# Patient Record
Sex: Male | Born: 1990
Health system: Southern US, Community
[De-identification: ages and names within clinical notes are randomized; demographics above are authoritative.]

## PROBLEM LIST (undated history)

## (undated) HISTORY — PX: FOOT SURGERY: SHX648

## (undated) HISTORY — PX: OTHER SURGICAL HISTORY: SHX169

---
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
## 2010-04-17 ENCOUNTER — Emergency Department (HOSPITAL_COMMUNITY): Admission: EM | Admit: 2010-04-17 | Discharge: 2010-04-17 | Payer: Self-pay | Admitting: Family Medicine

## 2010-09-15 ENCOUNTER — Emergency Department (HOSPITAL_COMMUNITY): Admission: EM | Admit: 2010-09-15 | Discharge: 2010-09-15 | Payer: Self-pay | Admitting: Family Medicine

## 2011-02-01 LAB — GC/CHLAMYDIA PROBE AMP, GENITAL
Chlamydia, DNA Probe: POSITIVE — AB
GC Probe Amp, Genital: NEGATIVE

## 2011-03-05 NOTE — Op Note (Signed)
NAMEADONAY, SCHEIER             ACCOUNT NO.:  [id number]   MEDICAL RECORD NO.:  [id number]          PATIENT TYPE:  AMB   LOCATION:  DSC                          FACILITY:  [facility]   PHYSICIAN:  Artist Pais. Weingold, M.D.DATE OF BIRTH:  10/01/1991   DATE OF PROCEDURE:  10/19/2006  DATE OF DISCHARGE:  10/19/2006                               OPERATIVE REPORT   PREOPERATIVE DIAGNOSIS:  Left fifth metacarpal fracture.   POSTOPERATIVE DIAGNOSIS:  Left fifth metacarpal fracture.   PROCEDURE:  Open reduction internal fixation using 1.6 mm intramedullary  rod.   SURGEON:  Artist Pais. Mina Marble, M.D.   ASSISTANT:  None.   ANESTHESIA:  Regional.   TOURNIQUET TIME:  35 minutes.   No complications or drains.   OPERATIVE REPORT:  The patient was taken to the operating suite.  After  the induction of adequate regional anesthetic, the left upper extremity  was prepped in the usual sterile fashion.  Once this was done, a small  incision was made over the base of the fifth metacarpal.  Skin was  incised.  Neurovascular bundles and tendons were carefully retracted.  The dorsal cortex of the base of the fifth metacarpal was exposed and  the IM rod was carefully introduced through a cortical window dorsally.  It was driven across the fracture site under direct fluoroscopic  guidance.  When reduction was achieved, it was cut outside the skin,  bent in a 90-degree fashion and cut below the skin.  The wound was  irrigated and loosely closed with 4-0 nylon.  A sterile dressing of  Xeroform, 4 x 4s, and a compressive ulnar gutter splint was applied.  The patient tolerated the procedure well and went to recovery room in  stable fashion.      Artist Pais Mina Marble, M.D.  Electronically Signed     MAW/MEDQ  D:  11/02/2006  T:  11/02/2006  Job:  [id number]

## 2011-03-05 NOTE — Op Note (Signed)
Craig Graham, Craig Graham             ACCOUNT NO.:  [id number]   MEDICAL RECORD NO.:  [id number]          PATIENT TYPE:  AMB   LOCATION:  DSC                          FACILITY:  [facility]   PHYSICIAN:  Artist Pais. Weingold, M.D.DATE OF BIRTH:  [birth year]   DATE OF PROCEDURE:  10/19/2006  DATE OF DISCHARGE:                               OPERATIVE REPORT   PREOPERATIVE DIAGNOSIS:  Displaced left fifth metacarpal fracture.   POSTOPERATIVE DIAGNOSIS:  Displaced left fifth metacarpal fracture.   PROCEDURE:  Open reduction and internal fixation of above with 1.6-mm  intramedullary rod.   SURGEON:  Artist Pais. Mina Marble, M.D.   ASSISTANT:  None.   ANESTHESIA:  General.   TOURNIQUET TIME:  26 minutes.   COMPLICATIONS:  None.   DRAINS:  None.   OPERATIVE REPORT:  The patient was taken to the operating suite.  After  the induction of adequate general anesthesia, the left upper extremity  was prepped and draped in sterile fashion.  An Esmarch was used to  exsanguinate the limb.  The tourniquet was inflated to 250 mmHg.  At  this point in time, a Jahss maneuver was performed and a closed  reduction of the fifth metacarpal fracture on the left hand was  undertaken.  At this point in time, a small incision was made over the  base of the fifth metacarpal at the [facility] joint.  The skin was incised.  Tendons were retracted.  The 1.6-mm IM rod awl was then used to create a  hole in the base of the metacarpal.  The IM rod was introduced into the  intramedullary canal.  It was driven across the fracture site under  direct and fluoroscopic guidance, thus, maintaining the reduction.  After this was done, the wound was irrigated.  Intraoperative  fluoroscopy revealed good reduction in both the A/P lateral and oblique  views.  The IM rod was cut outside the skin, bent upon itself and cut  below the skin with a wire cutter.  The wound was then closed with 4-0  nylon.  Xeroform, 4 x 4's and an ulnar gutter  splint were applied.  The  patient tolerated the procedure well and returned in stable fashion.      Artist Pais Mina Marble, M.D.  Electronically Signed     MAW/MEDQ  D:  10/19/2006  T:  10/19/2006  Job:  [id number]

## 2011-10-17 ENCOUNTER — Emergency Department (HOSPITAL_COMMUNITY)
Admission: EM | Admit: 2011-10-17 | Discharge: 2011-10-18 | Disposition: A | Discharge status: Home / Self Care | Payer: Medicaid Other | Attending: Emergency Medicine | Admitting: Emergency Medicine

## 2011-10-17 ENCOUNTER — Encounter: Payer: Self-pay | Admitting: *Deleted

## 2011-10-17 DIAGNOSIS — M5380 Other specified dorsopathies, site unspecified: Secondary | ICD-10-CM | POA: Insufficient documentation

## 2011-10-17 DIAGNOSIS — M545 Low back pain, unspecified: Secondary | ICD-10-CM | POA: Insufficient documentation

## 2011-10-17 DIAGNOSIS — F17200 Nicotine dependence, unspecified, uncomplicated: Secondary | ICD-10-CM | POA: Insufficient documentation

## 2011-10-17 DIAGNOSIS — M539 Dorsopathy, unspecified: Secondary | ICD-10-CM | POA: Insufficient documentation

## 2011-10-18 MED ORDER — IBUPROFEN 800 MG PO TABS: 800.0000 mg | ORAL_TABLET | Freq: Three times a day (TID) | ORAL | Status: AC

## 2011-10-18 MED ORDER — CYCLOBENZAPRINE HCL 10 MG PO TABS
10.0000 mg | ORAL_TABLET | Freq: Once | ORAL | Status: AC
  Administered 2011-10-18: 10 mg via ORAL
  Filled 2011-10-18: qty 1

## 2011-10-18 MED ORDER — CYCLOBENZAPRINE HCL 10 MG PO TABS: 10.0000 mg | ORAL_TABLET | Freq: Two times a day (BID) | ORAL | Status: AC | PRN

## 2011-10-18 MED ORDER — HYDROMORPHONE HCL PF 1 MG/ML IJ SOLN
1.0000 mg | Freq: Once | INTRAMUSCULAR | Status: AC
  Administered 2011-10-18: 1 mg via INTRAMUSCULAR
  Filled 2011-10-18: qty 1

## 2011-10-18 MED ORDER — IBUPROFEN 800 MG PO TABS
800.0000 mg | ORAL_TABLET | Freq: Once | ORAL | Status: AC
  Administered 2011-10-18: 800 mg via ORAL
  Filled 2011-10-18: qty 1

## 2011-10-18 NOTE — ED Provider Notes (Signed)
History     CSN: [id number]  Arrival date & time 10/17/11  2140   First MD Initiated Contact with Patient 10/18/11 0025      Chief Complaint  Patient presents with  . Back Pain    pt c/o lower back pain that began 1 week ago, denies injury or other symptoms.     (Consider location/radiation/quality/duration/timing/severity/associated sxs/prior treatment) Patient is a 20 y.o. male presenting with back pain. The history is provided by the patient.  Back Pain  This is a new problem. Episode onset: About 7 days ago. The problem occurs constantly. The problem has not changed since onset.Associated with: Has been lifting weights but no known injury or mechanism otherwise. The pain is present in the lumbar spine. The pain does not radiate. The pain is moderate. The symptoms are aggravated by bending, twisting and certain positions. The pain is the same all the time. Stiffness is present in the morning. Pertinent negatives include no chest pain, no fever, no numbness, no weight loss, no headaches, no abdominal pain, no bowel incontinence, no perianal numbness, no bladder incontinence, no dysuria, no pelvic pain, no leg pain, no paresthesias, no paresis, no tingling and no weakness. He has tried NSAIDs for the symptoms. The treatment provided mild relief. Risk factors: No history of same.   pain is sharp in quality and nonradiating. Pain is mostly right sided lumbar region.  History reviewed. No pertinent past medical history.  History reviewed. No pertinent past surgical history.  History reviewed. No pertinent family history.  History  Substance Use Topics  . Smoking status: Current Everyday Smoker    Types: Cigarettes  . Smokeless tobacco: Not on file  . Alcohol Use: No      Review of Systems  Constitutional: Negative for fever, chills and weight loss.  HENT: Negative for neck pain and neck stiffness.   Eyes: Negative for pain.  Respiratory: Negative for shortness of breath.     Cardiovascular: Negative for chest pain.  Gastrointestinal: Negative for abdominal pain, constipation and bowel incontinence.  Genitourinary: Negative for bladder incontinence, dysuria and pelvic pain.  Musculoskeletal: Positive for back pain. Negative for myalgias, joint swelling and gait problem.  Skin: Negative for rash.  Neurological: Negative for tingling, weakness, numbness, headaches and paresthesias.  All other systems reviewed and are negative.    Allergies  Hydrocodone  Home Medications   Current Outpatient Rx  Name Route Sig Dispense Refill  . IBUPROFEN 200 MG PO TABS Oral Take 600 mg by mouth every 6 (six) hours as needed. pain       BP 134/75  Pulse 66  Temp(Src) 98.7 F (37.1 C) (Oral)  Resp 18  Wt 152 lb (68.947 kg)  SpO2 100%  Physical Exam  Constitutional: He is oriented to person, place, and time. He appears well-developed and well-nourished.  HENT:  Head: Normocephalic and atraumatic.  Eyes: Conjunctivae and EOM are normal. Pupils are equal, round, and reactive to light.  Neck: Full passive range of motion without pain. Neck supple.  Cardiovascular: Normal rate, regular rhythm, S1 normal, S2 normal and intact distal pulses.   Pulmonary/Chest: Effort normal and breath sounds normal.  Abdominal: Soft. Bowel sounds are normal. There is no tenderness. There is no CVA tenderness.  Musculoskeletal: Normal range of motion.       Right para lumbar tenderness without midline tenderness or deformity. There is associated muscle spasm. No lower extremity deficits with equal DTRs, dorsi plantar flexion and sensorium to light touch throughout.  Neurological: He is alert and oriented to person, place, and time. He has normal strength and normal reflexes. No cranial nerve deficit or sensory deficit. He displays a negative Romberg sign. GCS eye subscore is 4. GCS verbal subscore is 5. GCS motor subscore is 6.       Normal Gait  Skin: Skin is warm and dry. No rash noted.  No cyanosis. Nails show no clubbing.  Psychiatric: He has a normal mood and affect. His speech is normal and behavior is normal.    ED Course  Procedures (including critical care time)  Ice and pain medications provided   MDM   Lumbar pain and muscle spasm in otherwise healthy young adult male has been lifting weights. Plan ice and conservative treatment with Motrin and Flexeril. Primary care provider referral provided. Patient states understanding strict return precautions and is stable for discharge home without indication for imaging at this time.        Sunnie Nielsen, MD 10/18/11 [phone]

## 2012-09-24 ENCOUNTER — Emergency Department (HOSPITAL_COMMUNITY)
Admission: EM | Admit: 2012-09-24 | Discharge: 2012-09-24 | Disposition: A | Discharge status: Home / Self Care | Payer: No Typology Code available for payment source | Attending: Emergency Medicine | Admitting: Emergency Medicine

## 2012-09-24 ENCOUNTER — Encounter (HOSPITAL_COMMUNITY): Payer: Self-pay | Admitting: Emergency Medicine

## 2012-09-24 ENCOUNTER — Emergency Department (HOSPITAL_COMMUNITY): Payer: No Typology Code available for payment source

## 2012-09-24 DIAGNOSIS — IMO0002 Reserved for concepts with insufficient information to code with codable children: Secondary | ICD-10-CM | POA: Insufficient documentation

## 2012-09-24 DIAGNOSIS — S8000XA Contusion of unspecified knee, initial encounter: Secondary | ICD-10-CM | POA: Insufficient documentation

## 2012-09-24 DIAGNOSIS — S8002XA Contusion of left knee, initial encounter: Secondary | ICD-10-CM

## 2012-09-24 DIAGNOSIS — R404 Transient alteration of awareness: Secondary | ICD-10-CM | POA: Insufficient documentation

## 2012-09-24 DIAGNOSIS — Y9389 Activity, other specified: Secondary | ICD-10-CM | POA: Insufficient documentation

## 2012-09-24 DIAGNOSIS — F17200 Nicotine dependence, unspecified, uncomplicated: Secondary | ICD-10-CM | POA: Insufficient documentation

## 2012-09-24 DIAGNOSIS — Y92410 Unspecified street and highway as the place of occurrence of the external cause: Secondary | ICD-10-CM | POA: Insufficient documentation

## 2012-09-24 DIAGNOSIS — S0990XA Unspecified injury of head, initial encounter: Secondary | ICD-10-CM | POA: Insufficient documentation

## 2012-09-24 MED ORDER — OXYCODONE-ACETAMINOPHEN 5-325 MG PO TABS
1.0000 | ORAL_TABLET | Freq: Once | ORAL | Status: AC
  Administered 2012-09-24: 1 via ORAL
  Filled 2012-09-24: qty 1
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
## 2012-09-25 NOTE — ED Provider Notes (Signed)
I have personally seen and examined the patient.  I have discussed the plan of care with the resident.  I have reviewed the documentation on PMH/FH/Soc. History.  I have reviewed the documentation of the resident and agree.  Doug Sou, MD 09/25/12 1134

## 2012-11-29 ENCOUNTER — Emergency Department (HOSPITAL_COMMUNITY)
Admission: EM | Admit: 2012-11-29 | Discharge: 2012-11-29 | Disposition: A | Discharge status: Home / Self Care | Payer: Medicare Other | Source: Home / Self Care | Attending: Emergency Medicine | Admitting: Emergency Medicine

## 2012-11-29 ENCOUNTER — Encounter (HOSPITAL_COMMUNITY): Payer: Self-pay | Admitting: *Deleted

## 2012-11-29 ENCOUNTER — Other Ambulatory Visit (HOSPITAL_COMMUNITY)
Admission: RE | Admit: 2012-11-29 | Discharge: 2012-11-29 | Disposition: A | Discharge status: Home / Self Care | Payer: Medicare Other | Source: Ambulatory Visit | Attending: Emergency Medicine | Admitting: Emergency Medicine

## 2012-11-29 DIAGNOSIS — Z202 Contact with and (suspected) exposure to infections with a predominantly sexual mode of transmission: Secondary | ICD-10-CM

## 2012-11-29 DIAGNOSIS — Z113 Encounter for screening for infections with a predominantly sexual mode of transmission: Secondary | ICD-10-CM | POA: Insufficient documentation

## 2012-11-29 LAB — RPR: RPR Ser Ql: NONREACTIVE

## 2012-11-29 MED ORDER — CEFTRIAXONE SODIUM 250 MG IJ SOLR
INTRAMUSCULAR | Status: AC
  Filled 2012-11-29: qty 250

## 2012-11-29 MED ORDER — CEFTRIAXONE SODIUM 250 MG IJ SOLR
250.0000 mg | Freq: Once | INTRAMUSCULAR | Status: AC
  Administered 2012-11-29: 250 mg via INTRAMUSCULAR

## 2012-11-29 MED ORDER — AZITHROMYCIN 250 MG PO TABS
ORAL_TABLET | ORAL | Status: AC
  Filled 2012-11-29: qty 4

## 2012-11-29 MED ORDER — AZITHROMYCIN 250 MG PO TABS
1000.0000 mg | ORAL_TABLET | Freq: Once | ORAL | Status: AC
  Administered 2012-11-29: 1000 mg via ORAL

## 2012-11-29 NOTE — ED Provider Notes (Signed)
Chief Complaint  Patient presents with  . Exposure to STD    History of Present Illness:   JOVEN MOM is a 22 year old male who was informed by his babies mother that she tested positive for Chlamydia. He was informed just this past week and her last unprotected sex was 5 days ago. He denies any urethral discharge, dysuria, or penile pain. He's had no lesions of the penis, sores, blisters, ulcers, jaundice, or rash. No inguinal lymphadenopathy or testicular pain or swelling. He denies any systemic symptoms such as fever, chills, skin rash, or joint pain.  Review of Systems:  Other than noted above, the patient denies any of the following symptoms: Systemic:  No fevers chills, aches, weight loss, arthralgias, myalgias, or adenopathy. GI:  No abdominal pain, nausea or vomiting. GU:  No dysuria, penile pain, discharge, itching, dysuria, genital lesions, testicular pain or swelling. Skin:  No rash or itching.  PMFSH:  Past medical history, family history, social history, meds, and allergies were reviewed.  Physical Exam:   Vital signs:  BP 130/86  Pulse 72  Temp(Src) 98.6 F (37 C) (Oral)  Resp 16  SpO2 100% Gen:  Alert, oriented, in no distress. Abdomen:  Soft and flat, non-distended, and non-tender.  No organomegaly or mass. Genital:  Exam is completely normal including no urethral discharge, no lesions of the penis, normal testes, no inguinal lymphadenopathy. Skin:  Warm and dry.  No rash.   Other Labs Obtained at [facility]gent [facility]:  DNA probes were obtained for gonorrhea, Chlamydia, Trichomonas as well as serologies for HIV and syphilis.  Results are pending at this time and we will call about any positive results.  Medications given in [facility]:  He was given Rocephin 250 mg IM and azithromycin 1000 mg by mouth.  Assessment:  The encounter diagnosis was Exposure to STD.  Plan:   1.  The following meds were prescribed:  There are no discharge medications for this patient.  2.   The patient was instructed in symptomatic care and handouts were given. 3.  The patient was told to return if becoming worse in any way, if no better in 3 or 4 days, and given some red flag symptoms that would indicate earlier return. 4.  The patient was instructed to inform all sexual contacts, avoid intercourse completely for 2 weeks and then only with a condom.  The patient was told that we would call about all abnormal lab results, and that we would need to report certain kinds of infection to the health department.    Reuben Likes, MD 11/29/12 [phone]

## 2012-11-29 NOTE — ED Notes (Signed)
Pt  States he  Was  Told  By  His   babys  Mother  That  She  Had  An std   - he  Reports  He  still Has  Relations   With   Her  He  denys  Any  Symptoms

## 2012-11-30 NOTE — ED Notes (Addendum)
HIV/RPR non-reactive, GC/Chlamydia pending. Craig Graham 11/30/2012 GC/Chlamydia/Trich neg. Craig Graham 12/04/2012

## 2015-09-14 ENCOUNTER — Emergency Department (HOSPITAL_COMMUNITY)
Admission: EM | Admit: 2015-09-14 | Discharge: 2015-09-14 | Disposition: A | Discharge status: Home / Self Care | Payer: Medicare Other | Attending: Emergency Medicine | Admitting: Emergency Medicine

## 2015-09-14 ENCOUNTER — Encounter (HOSPITAL_COMMUNITY): Payer: Self-pay | Admitting: *Deleted

## 2015-09-14 DIAGNOSIS — Y9389 Activity, other specified: Secondary | ICD-10-CM | POA: Diagnosis not present

## 2015-09-14 DIAGNOSIS — S0001XA Abrasion of scalp, initial encounter: Secondary | ICD-10-CM | POA: Diagnosis not present

## 2015-09-14 DIAGNOSIS — W503XXA Accidental bite by another person, initial encounter: Secondary | ICD-10-CM

## 2015-09-14 DIAGNOSIS — Y92810 Car as the place of occurrence of the external cause: Secondary | ICD-10-CM | POA: Insufficient documentation

## 2015-09-14 DIAGNOSIS — Y998 Other external cause status: Secondary | ICD-10-CM | POA: Insufficient documentation

## 2015-09-14 DIAGNOSIS — F17210 Nicotine dependence, cigarettes, uncomplicated: Secondary | ICD-10-CM | POA: Insufficient documentation

## 2015-09-14 DIAGNOSIS — S61251A Open bite of left index finger without damage to nail, initial encounter: Secondary | ICD-10-CM | POA: Insufficient documentation

## 2015-09-14 DIAGNOSIS — Z23 Encounter for immunization: Secondary | ICD-10-CM | POA: Diagnosis not present

## 2015-09-14 DIAGNOSIS — S6990XA Unspecified injury of unspecified wrist, hand and finger(s), initial encounter: Secondary | ICD-10-CM

## 2015-09-14 DIAGNOSIS — S61252A Open bite of right middle finger without damage to nail, initial encounter: Secondary | ICD-10-CM | POA: Diagnosis not present

## 2015-09-14 MED ORDER — TETANUS-DIPHTH-ACELL PERTUSSIS 5-2.5-18.5 LF-MCG/0.5 IM SUSP
0.5000 mL | Freq: Once | INTRAMUSCULAR | Status: AC
  Administered 2015-09-14: 0.5 mL via INTRAMUSCULAR
  Filled 2015-09-14: qty 0.5

## 2015-09-14 MED ORDER — AMOXICILLIN-POT CLAVULANATE 875-125 MG PO TABS: 1.0000 | ORAL_TABLET | Freq: Two times a day (BID) | ORAL | Status: DC

## 2015-09-14 MED ORDER — IBUPROFEN 400 MG PO TABS
800.0000 mg | ORAL_TABLET | Freq: Once | ORAL | Status: AC
  Administered 2015-09-14: 800 mg via ORAL
  Filled 2015-09-14: qty 2
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
## 2015-11-01 ENCOUNTER — Encounter (HOSPITAL_COMMUNITY): Payer: Self-pay | Admitting: *Deleted

## 2015-11-01 ENCOUNTER — Emergency Department (INDEPENDENT_AMBULATORY_CARE_PROVIDER_SITE_OTHER)
Admission: EM | Admit: 2015-11-01 | Discharge: 2015-11-01 | Disposition: A | Discharge status: Home / Self Care | Payer: Medicare Other | Source: Home / Self Care

## 2015-11-01 ENCOUNTER — Emergency Department (INDEPENDENT_AMBULATORY_CARE_PROVIDER_SITE_OTHER): Payer: Medicare Other

## 2015-11-01 DIAGNOSIS — S62609A Fracture of unspecified phalanx of unspecified finger, initial encounter for closed fracture: Secondary | ICD-10-CM

## 2015-11-01 DIAGNOSIS — S62310A Displaced fracture of base of second metacarpal bone, right hand, initial encounter for closed fracture: Secondary | ICD-10-CM | POA: Diagnosis not present

## 2015-11-01 MED ORDER — TRAMADOL HCL 50 MG PO TABS: 50.0000 mg | ORAL_TABLET | Freq: Four times a day (QID) | ORAL | Status: DC | PRN

## 2015-11-01 NOTE — Discharge Instructions (Signed)
See orthopedist on Monday for re-eval of broken finger.
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
## 2015-11-05 DIAGNOSIS — S62610A Displaced fracture of proximal phalanx of right index finger, initial encounter for closed fracture: Secondary | ICD-10-CM | POA: Diagnosis not present

## 2015-11-29 ENCOUNTER — Encounter (HOSPITAL_COMMUNITY): Payer: Self-pay | Admitting: Emergency Medicine

## 2015-11-29 ENCOUNTER — Emergency Department (INDEPENDENT_AMBULATORY_CARE_PROVIDER_SITE_OTHER)
Admission: EM | Admit: 2015-11-29 | Discharge: 2015-11-29 | Disposition: A | Discharge status: Home / Self Care | Payer: Medicare Other | Source: Home / Self Care | Attending: Family Medicine | Admitting: Family Medicine

## 2015-11-29 DIAGNOSIS — M5441 Lumbago with sciatica, right side: Secondary | ICD-10-CM | POA: Diagnosis not present

## 2015-11-29 MED ORDER — KETOROLAC TROMETHAMINE 60 MG/2ML IM SOLN
60.0000 mg | Freq: Once | INTRAMUSCULAR | Status: AC
  Administered 2015-11-29: 60 mg via INTRAMUSCULAR

## 2015-11-29 MED ORDER — CYCLOBENZAPRINE HCL 10 MG PO TABS: 10.0000 mg | ORAL_TABLET | Freq: Two times a day (BID) | ORAL | Status: DC | PRN

## 2015-11-29 MED ORDER — KETOROLAC TROMETHAMINE 60 MG/2ML IM SOLN
INTRAMUSCULAR | Status: AC
  Filled 2015-11-29: qty 2

## 2015-11-29 MED ORDER — NAPROXEN 500 MG PO TABS: 500.0000 mg | ORAL_TABLET | Freq: Two times a day (BID) | ORAL | Status: DC

## 2015-11-29 NOTE — ED Notes (Signed)
Patient c/o left side lower back pain and leg pain x 2 weeks. Patient reports it happened suddenly and as he was bending over. 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 Patient has tried various over the counter pain relief with no relief of symptoms. He has tried prescription muscle relaxer that does help with the pain. He is alert and oriented and in NAD.

## 2015-11-29 NOTE — Discharge Instructions (Signed)
It is a pleasure to see you today.    For the low back pain, I am prescribing the following two medications:  1. NAPROXEN  tablets, take 1 tablet by mouth every 12 hours.   2. CYCLOBENZAPRINE  tablets, take 1 tablet by mouth two times a day for muscle spasms.  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   Avoid bending/lifting.  You may use heating pad/hot water bottle for periodic relief.   Note for work is given.

## 2015-11-29 NOTE — ED Provider Notes (Signed)
CSN: [id number]     Arrival date & time 11/29/15  1304 History   First MD Initiated Contact with Patient 11/29/15 1339     Chief Complaint  Patient presents with  . Back Pain  . Leg Pain   (Consider location/radiation/quality/duration/timing/severity/associated sxs/prior Treatment) Patient is a 25 y.o. male presenting with back pain and leg pain. The history is provided by the patient. No language interpreter was used.  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 Sudden onset of pain on R side of back, radiated down back of R leg.  No urinary or bowel incontinence; has limited use of R leg due to pain but no true weakness.  No prior trauma to back or R leg.   Patient has been taking ibuprofen 2-4 tablets at a time with minimal relief. Works in [employer] and has had trouble with work due to pain (does lifting at work).   Has had girlfriend apply massage without true relief.    ROS No fevers/chills, no neck or UE pain.  No pain on L side of low back or L leg.  No falls. Did have a spasm along the R leg earlier today at work (noon) and this resolved on its own.   History reviewed. No pertinent past medical history. Past Surgical History  Procedure Laterality Date  . Left hand surgery     No family history on file. Social History  Substance Use Topics  . Smoking status: Current Every Day Smoker -- 1.00 packs/day    Types: Cigarettes  . Smokeless tobacco: Never Used  . Alcohol Use: No    Review of Systems  Constitutional: Negative for fever, chills and fatigue.  Musculoskeletal: Positive for back pain.  All other systems reviewed and are negative.   Allergies  Hydrocodone  Home Medications   Prior to Admission medications    Medication Sig Start Date End Date Taking? Authorizing Provider  amoxicillin-clavulanate (AUGMENTIN) 875-125 MG tablet Take 1 tablet by mouth 2 (two) times daily. 09/14/15   Cheri Fowler, PA-C  cyclobenzaprine (FLEXERIL) 10 MG tablet Take 1 tablet (10 mg total) by mouth 2 (two) times daily as needed for muscle spasms. 11/29/15   Barbaraann Barthel, MD  naproxen (NAPROSYN) 500 MG tablet Take 1 tablet (500 mg total) by mouth 2 (two) times daily. 11/29/15   Barbaraann Barthel, MD  traMADol (ULTRAM) 50 MG tablet Take 1 tablet (50 mg total) by mouth every 6 (six) hours as needed. 11/01/15   Linna Hoff, MD   Meds Ordered and Administered this Visit   Medications  ketorolac (TORADOL) injection 60 mg (not administered)    BP 134/86 mmHg  Pulse 93  Temp(Src) 98.6 F (37 C) (Oral)  SpO2 100% No data found.   Physical Exam  Constitutional: He appears well-developed and well-nourished.  Neck: Normal range of motion. Neck supple.  Musculoskeletal:  Full strength and sensation in UEs, shoulders.  Palpable radial pulses and full symmetric handgrip bilaterally.   Tenderness in paraspinous mm at the level of upper lumbar on the R side of midline. Not point tenderness over vertebral processes.  Tenderness at [facility] joint with palpation.  No skin changes in lower back.  Patient is able  to perform hip flexion bilaterally, grossly symmetric. SLR to 30 degrees on R, 45 degrees on L.  Lymphadenopathy:    He has no cervical adenopathy.  Neurological:  Full strength and sensation in UEs, shoulders.  Palpable radial pulses and full symmetric handgrip bilaterally.   Tenderness in paraspinous mm at the level of upper lumbar on the R side of midline. Not point tenderness over vertebral processes.  Tenderness at [facility] joint with palpation.  No skin changes in lower back.  Patient is able to perform hip flexion bilaterally, grossly symmetric. SLR to 30 degrees on R, 45 degrees on L. Sensation in both feet symmetric and grossly  intact. DP pulses bilaterally present.  No clonus. DTRs patellar 1+ symmetrically.  Dorsi/plantarflexion symmetric and full bilaterally.     ED Course  Procedures (including critical care time)  Labs Review Labs Reviewed - No data to display  Imaging Review No results found.   Visual Acuity Review  Right Eye Distance:   Left Eye Distance:   Bilateral Distance:    Right Eye Near:   Left Eye Near:    Bilateral Near:         MDM   1. Right-sided low back pain with right-sided sciatica    LBP with likely R sided radiculopathy, no s/sx of cauda equina syndrome.  No hyperreflexia or neurologic weakness. Treatment with Toradol  IM today; mainstay of care at home is NSAIDs and may benefit from muscle relaxants given paraspinous pain in LS region.  Discussed work restrictions for a few days.  Encouraged to establish with a primary care physician as well.  To return to [facility] if worsening or other concerns.   Hives with hydrocodone, avoidance of opioids due to this.   Paula Compton, MD    Barbaraann Barthel, MD 11/29/15 [phone]

## 2016-05-13 ENCOUNTER — Emergency Department
Admission: EM | Admit: 2016-05-13 | Discharge: 2016-05-13 | Disposition: A | Discharge status: Home / Self Care | Payer: Medicare Other | Attending: Student | Admitting: Student

## 2016-05-13 DIAGNOSIS — L02411 Cutaneous abscess of right axilla: Secondary | ICD-10-CM | POA: Diagnosis not present

## 2016-05-13 DIAGNOSIS — F1290 Cannabis use, unspecified, uncomplicated: Secondary | ICD-10-CM | POA: Insufficient documentation

## 2016-05-13 DIAGNOSIS — Z792 Long term (current) use of antibiotics: Secondary | ICD-10-CM | POA: Diagnosis not present

## 2016-05-13 DIAGNOSIS — F17210 Nicotine dependence, cigarettes, uncomplicated: Secondary | ICD-10-CM | POA: Diagnosis not present

## 2016-05-13 DIAGNOSIS — Z79899 Other long term (current) drug therapy: Secondary | ICD-10-CM | POA: Insufficient documentation

## 2016-05-13 DIAGNOSIS — L539 Erythematous condition, unspecified: Secondary | ICD-10-CM | POA: Diagnosis present

## 2016-05-13 MED ORDER — IBUPROFEN 600 MG PO TABS: 600.0000 mg | ORAL_TABLET | Freq: Three times a day (TID) | ORAL | 0 refills | Status: DC | PRN

## 2016-05-13 MED ORDER — TRAMADOL HCL 50 MG PO TABS: 50.0000 mg | ORAL_TABLET | Freq: Four times a day (QID) | ORAL | 0 refills | Status: DC | PRN

## 2016-05-13 MED ORDER — LIDOCAINE-EPINEPHRINE (PF) 1 %-1:200000 IJ SOLN
INTRAMUSCULAR | Status: AC
  Filled 2016-05-13: qty 30

## 2016-05-13 MED ORDER — TRAMADOL HCL 50 MG PO TABS
50.0000 mg | ORAL_TABLET | Freq: Once | ORAL | Status: AC
  Administered 2016-05-13: 50 mg via ORAL
  Filled 2016-05-13: qty 1

## 2016-05-13 MED ORDER — IBUPROFEN 600 MG PO TABS
600.0000 mg | ORAL_TABLET | Freq: Once | ORAL | Status: AC
  Administered 2016-05-13: 600 mg via ORAL
  Filled 2016-05-13: qty 1

## 2016-05-13 MED ORDER — SULFAMETHOXAZOLE-TRIMETHOPRIM 800-160 MG PO TABS: 1.0000 | ORAL_TABLET | Freq: Two times a day (BID) | ORAL | 0 refills | Status: DC

## 2016-05-13 NOTE — ED Provider Notes (Signed)
[facility] Emergency Department Provider Note   ____________________________________________   First MD Initiated Contact with Patient 05/13/16 [phone]     (approximate)  I have reviewed the triage vital signs and the nursing notes.   HISTORY  Chief Complaint Abscess    HPI Craig Graham is a 25 y.o. male complaining of a nausea lesion right axillary area for 3 days. Patient stated mild discharge. Patient state first occurrence of this, complaint.Patient rates the pain as a 10 over 10. No palliative measures for this complaint.   History reviewed. No pertinent past medical history.  There are no active problems to display for this patient.   Past Surgical History:  Procedure Laterality Date  . left hand surgery      Prior to Admission medications   Medication Sig Start Date End Date Taking? Authorizing Provider  amoxicillin-clavulanate (AUGMENTIN) 875-125 MG tablet Take 1 tablet by mouth 2 (two) times daily. 09/14/15   Cheri Fowler, PA-C  cyclobenzaprine (FLEXERIL) 10 MG tablet Take 1 tablet (10 mg total) by mouth 2 (two) times daily as needed for muscle spasms. 11/29/15   Barbaraann Barthel, MD  naproxen (NAPROSYN) 500 MG tablet Take 1 tablet (500 mg total) by mouth 2 (two) times daily. 11/29/15   Barbaraann Barthel, MD  traMADol (ULTRAM) 50 MG tablet Take 1 tablet (50 mg total) by mouth every 6 (six) hours as needed. 11/01/15   Linna Hoff, MD    Allergies Hydrocodone  No family history on file.  Social History Social History  Substance Use Topics  . Smoking status: Current Every Day Smoker    Packs/day: 1.00    Types: Cigarettes  . Smokeless tobacco: Never Used  . Alcohol use No    Review of Systems Constitutional: No fever/chills Eyes: No visual changes. ENT: No sore throat. Cardiovascular: Denies chest pain. Respiratory: Denies shortness of breath. Gastrointestinal: No abdominal pain.  No nausea, no vomiting.  No diarrhea.  No  constipation. Genitourinary: Negative for dysuria. Musculoskeletal: Negative for back pain. Skin: Negative for rash.Swelling right axillary area Neurological: Negative for headaches, focal weakness or numbness.   ____________________________________________   PHYSICAL EXAM:  VITAL SIGNS: ED Triage Vitals  Enc Vitals Group     BP 05/13/16 0858 127/70     Pulse Rate 05/13/16 0858 74     Resp 05/13/16 0858 16     Temp 05/13/16 0858 98.1 F (36.7 C)     Temp Source 05/13/16 0858 Oral     SpO2 05/13/16 0858 100 %     Weight 05/13/16 0858 172 lb (78 kg)     Height 05/13/16 0858  (1.803 m)     Head Circumference --      Peak Flow --      Pain Score 05/13/16 0853 10     Pain Loc --      Pain Edu? --      Excl. in GC? --     Constitutional: Alert and oriented. Well appearing and in no acute distress. Eyes: Conjunctivae are normal. PERRL. EOMI. Head: Atraumatic. Nose: No congestion/rhinnorhea. Mouth/Throat: Mucous membranes are moist.  Oropharynx non-erythematous. Neck: No stridor.  No cervical spine tenderness to palpation. Hematological/Lymphatic/Immunilogical: No cervical lymphadenopathy. Cardiovascular: Normal rate, regular rhythm. Grossly normal heart sounds.  Good peripheral circulation. Respiratory: Normal respiratory effort.  No retractions. Lungs CTAB. Gastrointestinal: Soft and nontender. No distention. No abdominal bruits. No CVA tenderness. Musculoskeletal: No lower extremity tenderness nor edema.  No joint  effusions. Neurologic:  Normal speech and language. No gross focal neurologic deficits are appreciated. No gait instability. Skin:  Skin is warm, dry and intact. No rash noted. Erythematous edematous nodular lesion right axillary area. Psychiatric: Mood and affect are normal. Speech and behavior are normal.  ____________________________________________   LABS (all labs ordered are listed, but only abnormal results are displayed)  Labs Reviewed - No data  to display ____________________________________________  EKG   ____________________________________________  RADIOLOGY   ____________________________________________   PROCEDURES  Procedure(s) performed: INCISION AND DRAINAGE Performed by: Joni Reining Consent: Verbal consent obtained. Risks and benefits: risks, benefits and alternatives were discussed Type: abscess  Body area: Right axillary  Anesthesia: local infiltration  Incision was made with a scalpel.  Local anesthetic: lidocaine 1% with epinephrine  Anesthetic total: 5 ml  Complexity: complex Blunt dissection to break up loculations  Drainage: purulent  Drainage amount: Small Packing material: 1/4 in iodoform gauze  Patient tolerance: Patient tolerated the procedure well with no immediate complications.     Procedures  Critical Care performed: No  ____________________________________________   INITIAL IMPRESSION / ASSESSMENT AND PLAN / ED COURSE  Pertinent labs & imaging results that were available during my care of the patient were reviewed by me and considered in my medical decision making (see chart for details).  Right axillary abscess. Status post incision and drainage patient given discharge care instructions. Patient advised return back in 2 days for reevaluation. Patient given a prescription for Bactrim DS, tramadol, and ibuprofen.  Clinical Course     ____________________________________________   FINAL CLINICAL IMPRESSION(S) / ED DIAGNOSES  Final diagnoses:  None      NEW MEDICATIONS STARTED DURING THIS VISIT:  New Prescriptions   No medications on file     Note:  This document was prepared using Dragon voice recognition software and may include unintentional dictation errors.    Joni Reining, PA-C 05/13/16 [id number]    Gayla Doss, MD 05/13/16 [phone]

## 2016-05-13 NOTE — ED Triage Notes (Signed)
Pt c/o abscess to the right axillary for the past 3 days with some drainage noted.Craig Graham

## 2016-05-15 ENCOUNTER — Encounter: Payer: Self-pay | Admitting: Emergency Medicine

## 2016-05-15 ENCOUNTER — Emergency Department
Admission: EM | Admit: 2016-05-15 | Discharge: 2016-05-15 | Disposition: A | Discharge status: Home / Self Care | Payer: Medicare Other | Attending: Emergency Medicine | Admitting: Emergency Medicine

## 2016-05-15 DIAGNOSIS — F17210 Nicotine dependence, cigarettes, uncomplicated: Secondary | ICD-10-CM | POA: Insufficient documentation

## 2016-05-15 DIAGNOSIS — Z5189 Encounter for other specified aftercare: Secondary | ICD-10-CM

## 2016-05-15 DIAGNOSIS — Z4800 Encounter for change or removal of nonsurgical wound dressing: Secondary | ICD-10-CM | POA: Diagnosis not present

## 2016-05-15 MED ORDER — OXYCODONE-ACETAMINOPHEN 5-325 MG PO TABS: 1.0000 | ORAL_TABLET | ORAL | 0 refills | Status: DC | PRN

## 2016-05-15 MED ORDER — LIDOCAINE HCL (PF) 1 % IJ SOLN
INTRAMUSCULAR | Status: AC
  Administered 2016-05-15: 10:00:00
  Filled 2016-05-15: qty 5

## 2016-05-15 NOTE — ED Provider Notes (Signed)
[facility] Emergency Department Provider Note  ____________________________________________  Time seen: Approximately 9:12 AM  I have reviewed the triage vital signs and the nursing notes.   HISTORY  Chief Complaint Wound Check    HPI Craig Graham is a 25 y.o. male presents for evaluation of abscess on his right axilla times one day. Patient was seen yesterday, had an I&D performed and is here for recheck. Denies any complaints at this time.   History reviewed. No pertinent past medical history.  There are no active problems to display for this patient.   Past Surgical History:  Procedure Laterality Date  . left hand surgery      Prior to Admission medications   Medication Sig Start Date End Date Taking? Authorizing Provider  amoxicillin-clavulanate (AUGMENTIN) 875-125 MG tablet Take 1 tablet by mouth 2 (two) times daily. 09/14/15   Cheri Fowler, PA-C  cyclobenzaprine (FLEXERIL) 10 MG tablet Take 1 tablet (10 mg total) by mouth 2 (two) times daily as needed for muscle spasms. 11/29/15   Barbaraann Barthel, MD  ibuprofen (ADVIL,MOTRIN) 600 MG tablet Take 1 tablet (600 mg total) by mouth every 8 (eight) hours as needed. 05/13/16   Joni Reining, PA-C  naproxen (NAPROSYN) 500 MG tablet Take 1 tablet (500 mg total) by mouth 2 (two) times daily. 11/29/15   Barbaraann Barthel, MD  oxyCODONE-acetaminophen (ROXICET) 5-325 MG tablet Take 1-2 tablets by mouth every 4 (four) hours as needed for severe pain. 05/15/16   Charmayne Sheer Vishwa Dais, PA-C  sulfamethoxazole-trimethoprim (BACTRIM DS,SEPTRA DS) 800-160 MG tablet Take 1 tablet by mouth 2 (two) times daily. 05/13/16   Joni Reining, PA-C    Allergies Hydrocodone  History reviewed. No pertinent family history.  Social History Social History  Substance Use Topics  . Smoking status: Current Every Day Smoker    Packs/day: 1.00    Types: Cigarettes  . Smokeless tobacco: Never Used  . Alcohol use No    Review of  Systems Constitutional: No fever/chills Cardiovascular: Denies chest pain. Respiratory: Denies shortness of breath. Genitourinary: Negative for dysuria. Musculoskeletal: Negative for back pain. Skin: Right axilla with a 2 x 3 cm area of tenderness and firmness. No increased erythema Neurological: Negative for headaches, focal weakness or numbness.  10-point ROS otherwise negative.  ____________________________________________   PHYSICAL EXAM:  VITAL SIGNS: ED Triage Vitals  Enc Vitals Group     BP      Pulse      Resp      Temp      Temp src      SpO2      Weight      Height      Head Circumference      Peak Flow      Pain Score      Pain Loc      Pain Edu?      Excl. in GC?     Constitutional: Alert and oriented. Well appearing and in no acute distress. Neck: No stridor. Supple, from, nt  Cardiovascular: Normal rate, regular rhythm. Grossly normal heart sounds.  Good peripheral circulation. Respiratory: Normal respiratory effort.  No retractions. Lungs CTAB. Musculoskeletal: No lower extremity tenderness nor edema.  No joint effusions. Neurologic:  Normal speech and language. No gross focal neurologic deficits are appreciated. No gait instability. Skin:  Skin is warm, dry and intact. No rash noted. Packing removed cleansed area with Betadine and attempted to remove any more pus none were available.  Repacked with 1 inch iodoform gauze and patient follow-up in 48 hours for recheck. Psychiatric: Mood and affect are normal. Speech and behavior are normal.  ____________________________________________   LABS (all labs ordered are listed, but only abnormal results are displayed)  Labs Reviewed - No data to display ____________________________________________  EKG   ____________________________________________  RADIOLOGY   ____________________________________________   PROCEDURES  Procedure(s) performed: None  Critical Care performed:  No  ____________________________________________   INITIAL IMPRESSION / ASSESSMENT AND PLAN / ED COURSE  Pertinent labs & imaging results that were available during my care of the patient were reviewed by me and considered in my medical decision making (see chart for details).  Continued all her medications. Stopped ibuprofen and placed on Percocet 5/325 for pain.  Clinical Course   Skin:  Skin is warm, dry and intact. No rash noted. Packing removed cleansed area with Betadine and attempted to remove any more pus none was available. Repacked with 1 inch iodoform gauze and patient follow-up in 48 hours for recheck. ____________________________________________   FINAL CLINICAL IMPRESSION(S) / ED DIAGNOSES  Final diagnoses:  Abscess re-check     This chart was dictated using voice recognition software/Dragon. Despite best efforts to proofread, errors can occur which can change the meaning. Any change was purely unintentional.    Evangeline Dakin, PA-C 05/15/16 8295    Nita Sickle, MD 05/15/16 [phone]

## 2016-05-15 NOTE — ED Notes (Signed)
NAD noted at time of D/C. Pt denies questions or concerns. Pt ambulatory to the lobby at this time.  

## 2016-05-15 NOTE — ED Notes (Signed)
Pt presents with abscess that was lanced on 7/27, pt presents with packing intact at this time, serosanguinous drainage noted to packing and dressing at this time. PA to bedside.

## 2016-05-15 NOTE — ED Triage Notes (Signed)
Pt presents for wound recheck of abscess to R axillary that was drained on 05/13/2016.

## 2016-05-16 LAB — AEROBIC CULTURE  (SUPERFICIAL SPECIMEN): SPECIAL REQUESTS: NORMAL

## 2016-05-16 LAB — AEROBIC CULTURE W GRAM STAIN (SUPERFICIAL SPECIMEN): Gram Stain: NONE SEEN

## 2016-05-17 ENCOUNTER — Emergency Department
Admission: EM | Admit: 2016-05-17 | Discharge: 2016-05-17 | Disposition: A | Discharge status: Home / Self Care | Payer: Medicare Other | Attending: Emergency Medicine | Admitting: Emergency Medicine

## 2016-05-17 DIAGNOSIS — Z5189 Encounter for other specified aftercare: Secondary | ICD-10-CM

## 2016-05-17 DIAGNOSIS — F17210 Nicotine dependence, cigarettes, uncomplicated: Secondary | ICD-10-CM | POA: Insufficient documentation

## 2016-05-17 DIAGNOSIS — Z4800 Encounter for change or removal of nonsurgical wound dressing: Secondary | ICD-10-CM | POA: Insufficient documentation

## 2016-05-17 MED ORDER — SULFAMETHOXAZOLE-TRIMETHOPRIM 800-160 MG PO TABS: 1.0000 | ORAL_TABLET | Freq: Two times a day (BID) | ORAL | 0 refills | Status: DC

## 2016-05-17 NOTE — ED Provider Notes (Signed)
[facility] Emergency Department Provider Note  ____________________________________________  Time seen: Approximately 9:45 AM  I have reviewed the triage vital signs and the nursing notes.   HISTORY  Chief Complaint Wound Check  HPI Craig Graham is a 25 y.o. male who presents to the emergency department for a wound check.He reports that the packing fell out of the site of I&D yesterday. He states that he has continued to notice some drainage, but otherwise is feeling much better. He is taking his antibiotic as prescribed.  History reviewed. No pertinent past medical history.  There are no active problems to display for this patient.   Past Surgical History:  Procedure Laterality Date  . left hand surgery      Prior to Admission medications   Medication Sig Start Date End Date Taking? Authorizing Provider  amoxicillin-clavulanate (AUGMENTIN) 875-125 MG tablet Take 1 tablet by mouth 2 (two) times daily. 09/14/15   Cheri Fowler, PA-C  cyclobenzaprine (FLEXERIL) 10 MG tablet Take 1 tablet (10 mg total) by mouth 2 (two) times daily as needed for muscle spasms. 11/29/15   Barbaraann Barthel, MD  ibuprofen (ADVIL,MOTRIN) 600 MG tablet Take 1 tablet (600 mg total) by mouth every 8 (eight) hours as needed. 05/13/16   Joni Reining, PA-C  naproxen (NAPROSYN) 500 MG tablet Take 1 tablet (500 mg total) by mouth 2 (two) times daily. 11/29/15   Barbaraann Barthel, MD  oxyCODONE-acetaminophen (ROXICET) 5-325 MG tablet Take 1-2 tablets by mouth every 4 (four) hours as needed for severe pain. 05/15/16   Charmayne Sheer Beers, PA-C  sulfamethoxazole-trimethoprim (BACTRIM DS,SEPTRA DS) 800-160 MG tablet Take 1 tablet by mouth 2 (two) times daily. 05/13/16   Joni Reining, PA-C  sulfamethoxazole-trimethoprim (BACTRIM DS,SEPTRA DS) 800-160 MG tablet Take 1 tablet by mouth 2 (two) times daily. 05/17/16   Chinita Pester, FNP    Allergies Hydrocodone  No family history on file.  Social  History Social History  Substance Use Topics  . Smoking status: Current Every Day Smoker    Packs/day: 1.00    Types: Cigarettes  . Smokeless tobacco: Never Used  . Alcohol use No    Review of Systems Constitutional: No fever/chills Eyes: No visual changes. ENT: No sore throat. Cardiovascular: Denies chest pain. Respiratory: Denies shortness of breath. Gastrointestinal: No abdominal pain.  No nausea, no vomiting.  Musculoskeletal: Negative for back pain. Skin: Right axilla surgical wound. Neurological: Negative for headaches, focal weakness or numbness. ___________________________________________   PHYSICAL EXAM:  VITAL SIGNS: ED Triage Vitals [05/17/16 0943]  Enc Vitals Group     BP 129/74     Pulse Rate 87     Resp 18     Temp 98.7 F (37.1 C)     Temp Source Oral     SpO2 100 %     Weight 172 lb (78 kg)     Height 5\' 11"  (1.803 m)     Head Circumference      Peak Flow      Pain Score      Pain Loc      Pain Edu?      Excl. in GC?     Constitutional: Alert and oriented. Well appearing and in no acute distress. Eyes: Conjunctivae are normal. EOMI. Nose: No congestion/rhinnorhea. Mouth/Throat: Mucous membranes are moist.  Cardiovascular:Good peripheral circulation. Respiratory: Normal respiratory effort.  No retractions.  Musculoskeletal: Full ROM throughout. Neurologic:  Normal speech and language. No gross focal neurologic deficits  are appreciated. Speech is normal. No gait instability. Skin: Mildly indurated around the site of I&D of the right axilla with scant purulent drainage.   Psychiatric: Mood and affect are normal. Speech and behavior are normal.  ____________________________________________   LABS (all labs ordered are listed, but only abnormal results are displayed)  Labs Reviewed - No data to display ____________________________________________  RADIOLOGY  Not  indicated ____________________________________________   PROCEDURES  Procedure(s) performed:   ___________________________________________   INITIAL IMPRESSION / ASSESSMENT AND PLAN / ED COURSE  Pertinent labs & imaging results that were available during my care of the patient were reviewed by me and considered in my medical decision making (see chart for details).  Bactrim extended for 4 additional days to total 14 days of antibiotics. Patient was instructed to contact a surgeon if his symptoms began to return.  Patient was advised to follow up with the primary care provider or surgeon for symptoms of concern or return to the emergency department if unable to schedule an appointment. ____________________________________________   FINAL CLINICAL IMPRESSION(S) / ED DIAGNOSES  Final diagnoses:  Visit for wound check    Note:  This document was prepared using Dragon voice recognition software and may include unintentional dictation errors.     Chinita Pester, FNP 05/18/16 1552    Minna Antis, MD 05/20/16 [phone]

## 2016-05-17 NOTE — ED Triage Notes (Signed)
Pt states he is here for a wound recheck, states he had I/D last wednesday and wound check with repack on Saturday and instructed to return today to have rechecked again . States feeling much better today.

## 2016-05-17 NOTE — Progress Notes (Signed)
ED Antimicrobial Stewardship Positive Culture Follow Up   Craig Graham is an 25 y.o. male who presented to [facility] on 05/13/2016 with a chief complaint of  Chief Complaint  Patient presents with  . Wound Check   Culture on 07/27 revealed aerobic culture growing abundant methicillin resistant Staphylococcus aureus and Ecoli. Patient was discharged on sulfamethoxazole-trimethoprim 800-160 bid x 10 days. Sensitivity revealed organism is sensitive to sulfamethoxazole-trimethoprim. No changes needed at this time.  Horris Latino, PharmD Pharmacy Resident 05/17/2016 1:56 PM

## 2016-08-21 ENCOUNTER — Ambulatory Visit (HOSPITAL_COMMUNITY)
Admission: EM | Admit: 2016-08-21 | Discharge: 2016-08-21 | Disposition: A | Discharge status: Home / Self Care | Payer: Medicare Other | Attending: Emergency Medicine | Admitting: Emergency Medicine

## 2016-08-21 ENCOUNTER — Encounter (HOSPITAL_COMMUNITY): Payer: Self-pay | Admitting: *Deleted

## 2016-08-21 DIAGNOSIS — H9203 Otalgia, bilateral: Secondary | ICD-10-CM | POA: Diagnosis not present

## 2016-08-21 DIAGNOSIS — R51 Headache: Secondary | ICD-10-CM | POA: Diagnosis not present

## 2016-08-21 DIAGNOSIS — F17210 Nicotine dependence, cigarettes, uncomplicated: Secondary | ICD-10-CM | POA: Insufficient documentation

## 2016-08-21 DIAGNOSIS — J039 Acute tonsillitis, unspecified: Secondary | ICD-10-CM | POA: Insufficient documentation

## 2016-08-21 DIAGNOSIS — J029 Acute pharyngitis, unspecified: Secondary | ICD-10-CM | POA: Diagnosis present

## 2016-08-21 LAB — POCT RAPID STREP A: STREPTOCOCCUS, GROUP A SCREEN (DIRECT): NEGATIVE

## 2016-08-21 MED ORDER — AMOXICILLIN-POT CLAVULANATE 875-125 MG PO TABS
1.0000 | ORAL_TABLET | Freq: Two times a day (BID) | ORAL | 0 refills | Status: DC
Start: 2016-08-21 — End: 2016-12-28

## 2016-08-21 MED ORDER — LIDOCAINE VISCOUS 2 % MT SOLN: 10.0000 mL | OROMUCOSAL | 0 refills | Status: DC | PRN

## 2016-08-21 NOTE — Discharge Instructions (Signed)
Start Augmentin twice a day as directed. Use Lidocaine mouth wash - swish and spit out every 3 hours as needed for pain. May continue Ibuprofen 800mg  every 8 hours as needed for pain. Follow-up in 2 days if not improving or go to ER if symptoms worsen.

## 2016-08-21 NOTE — ED Provider Notes (Signed)
CSN: [id number]     Arrival date & time 08/21/16  1207 History   First MD Initiated Contact with Patient 08/21/16 1336     Chief Complaint  Patient presents with  . Sore Throat  . Headache  . Otalgia   (Consider location/radiation/quality/duration/timing/severity/associated sxs/prior Treatment) 25 year old male presents with severe sore throat, bilateral ear pain, headaches and low grade fever for the past 3 days. Denies any nasal congestion or cough. Did have a cold 10 days ago that resolved. Has taken Advil, Tylenol, Nyquil with no relief.    The history is provided by the patient.    History reviewed. No pertinent past medical history. Past Surgical History:  Procedure Laterality Date  . left hand surgery     History reviewed. No pertinent family history. Social History  Substance Use Topics  . Smoking status: Current Every Day Smoker    Packs/day: 1.00    Types: Cigarettes  . Smokeless tobacco: Never Used  . Alcohol use No    Review of Systems  Constitutional: Positive for appetite change, chills, diaphoresis, fatigue and fever.  HENT: Positive for ear pain, postnasal drip, sore throat and trouble swallowing. Negative for congestion, rhinorrhea, sinus pressure and sneezing.   Eyes: Negative for discharge.  Respiratory: Negative for cough, chest tightness, shortness of breath and wheezing.   Cardiovascular: Negative for chest pain.  Gastrointestinal: Negative for abdominal pain, diarrhea, nausea and vomiting.  Musculoskeletal: Negative for neck pain and neck stiffness.  Skin: Negative for rash.  Neurological: Positive for headaches. Negative for dizziness, weakness, light-headedness and numbness.    Allergies  Hydrocodone  Home Medications   Prior to Admission medications   Medication Sig Start Date End Date Taking? Authorizing Provider  ibuprofen (ADVIL,MOTRIN) 600 MG tablet Take 1 tablet (600 mg total) by mouth every 8 (eight) hours as needed. 05/13/16  Yes  Joni Reiningonald K Smith, PA-C  amoxicillin-clavulanate (AUGMENTIN) 875-125 MG tablet Take 1 tablet by mouth every 12 (twelve) hours. 08/21/16   Sudie GrumblingAnn Berry Annice Jolly, NP  lidocaine (XYLOCAINE) 2 % solution Use as directed 10 mLs in the mouth or throat every 3 (three) hours as needed for mouth pain. 08/21/16   Sudie GrumblingAnn Berry Salisa Broz, NP   Meds Ordered and Administered this Visit  Medications - No data to display  BP 142/89 (BP Location: Left Arm)   Pulse 88   Temp 98.9 F (37.2 C) (Oral)   Resp 17   SpO2 100%  No data found.   Physical Exam  Constitutional: He is oriented to person, place, and time. He appears well-developed and well-nourished. He appears ill. No distress.  HENT:  Head: Normocephalic and atraumatic.  Right Ear: Hearing, tympanic membrane, external ear and ear canal normal.  Left Ear: Hearing, tympanic membrane, external ear and ear canal normal.  Nose: Nose normal. Right sinus exhibits no maxillary sinus tenderness and no frontal sinus tenderness. Left sinus exhibits no maxillary sinus tenderness and no frontal sinus tenderness.  Mouth/Throat: Uvula is midline and mucous membranes are normal. Posterior oropharyngeal edema and posterior oropharyngeal erythema present. No oropharyngeal exudate or tonsillar abscesses. Tonsils are 3+ on the right. Tonsils are 3+ on the left.  Neck: Normal range of motion. Neck supple.  Cardiovascular: Normal rate, regular rhythm and normal heart sounds.   Pulmonary/Chest: Effort normal and breath sounds normal. He has no wheezes. He has no rales. He exhibits no tenderness.  Lymphadenopathy:    He has cervical adenopathy.       Right cervical:  Superficial cervical adenopathy present.       Left cervical: Superficial cervical adenopathy present.  Neurological: He is alert and oriented to person, place, and time.  Skin: Skin is warm and dry. Capillary refill takes less than 2 seconds.  Psychiatric: He has a normal mood and affect. His behavior is normal. Judgment  and thought content normal.    Urgent Care Course   Clinical Course    Procedures (including critical care time)  Labs Review Labs Reviewed  CULTURE, GROUP A STREP [facility]  POCT RAPID STREP A    Imaging Review No results found.   Visual Acuity Review  Right Eye Distance:   Left Eye Distance:   Bilateral Distance:    Right Eye Near:   Left Eye Near:    Bilateral Near:         MDM   1. Acute tonsillitis, unspecified etiology    Recommend start Augmentin twice a day as directed. Use Lidocaine mouth wash - swish and spit out every 3 hours as needed for pain. May continue Ibuprofen 800mg  every 8 hours as needed for pain. Follow-up in 2 days if not improving or go to ER if symptoms worsen.     Sudie GrumblingAnn Berry Wilberta Dorvil, NP 08/21/16 2329

## 2016-08-21 NOTE — ED Triage Notes (Signed)
Patient reports 3 day history of sore throat, fevers, bilateral ear pain, and headache.

## 2016-08-24 LAB — CULTURE, GROUP A STREP (THRC)

## 2016-12-28 ENCOUNTER — Ambulatory Visit (HOSPITAL_COMMUNITY)
Admission: EM | Admit: 2016-12-28 | Discharge: 2016-12-28 | Disposition: A | Discharge status: Home / Self Care | Payer: Medicare Other | Attending: Internal Medicine | Admitting: Internal Medicine

## 2016-12-28 ENCOUNTER — Encounter (HOSPITAL_COMMUNITY): Payer: Self-pay | Admitting: Emergency Medicine

## 2016-12-28 ENCOUNTER — Emergency Department (HOSPITAL_COMMUNITY): Payer: Medicare Other

## 2016-12-28 ENCOUNTER — Emergency Department (HOSPITAL_COMMUNITY)
Admission: EM | Admit: 2016-12-28 | Discharge: 2016-12-28 | Disposition: A | Discharge status: Home / Self Care | Payer: Medicare Other | Attending: Emergency Medicine | Admitting: Emergency Medicine

## 2016-12-28 ENCOUNTER — Encounter (HOSPITAL_COMMUNITY): Payer: Self-pay

## 2016-12-28 DIAGNOSIS — R1110 Vomiting, unspecified: Secondary | ICD-10-CM

## 2016-12-28 DIAGNOSIS — R109 Unspecified abdominal pain: Secondary | ICD-10-CM | POA: Diagnosis not present

## 2016-12-28 DIAGNOSIS — R1031 Right lower quadrant pain: Secondary | ICD-10-CM | POA: Diagnosis not present

## 2016-12-28 DIAGNOSIS — R197 Diarrhea, unspecified: Secondary | ICD-10-CM

## 2016-12-28 DIAGNOSIS — R112 Nausea with vomiting, unspecified: Secondary | ICD-10-CM | POA: Insufficient documentation

## 2016-12-28 DIAGNOSIS — F17210 Nicotine dependence, cigarettes, uncomplicated: Secondary | ICD-10-CM | POA: Diagnosis not present

## 2016-12-28 LAB — URINALYSIS, ROUTINE W REFLEX MICROSCOPIC
Bilirubin Urine: NEGATIVE
GLUCOSE, UA: NEGATIVE mg/dL
HGB URINE DIPSTICK: NEGATIVE
KETONES UR: NEGATIVE mg/dL
LEUKOCYTES UA: NEGATIVE
Nitrite: NEGATIVE
PROTEIN: NEGATIVE mg/dL
Specific Gravity, Urine: >1.046 — ABNORMAL HIGH (ref 1.005–1.030)
pH: 8 (ref 5.0–8.0)

## 2016-12-28 LAB — COMPREHENSIVE METABOLIC PANEL
ALK PHOS: 54 U/L (ref 38–126)
ALT: 25 U/L (ref 17–63)
AST: 26 U/L (ref 15–41)
Albumin: 4.7 g/dL (ref 3.5–5.0)
Anion gap: 12 (ref 5–15)
BUN: 5 mg/dL — AB (ref 6–20)
CALCIUM: 9.8 mg/dL (ref 8.9–10.3)
CHLORIDE: 98 mmol/L — AB (ref 101–111)
CO2: 27 mmol/L (ref 22–32)
CREATININE: 1.02 mg/dL (ref 0.61–1.24)
Glucose, Bld: 102 mg/dL — ABNORMAL HIGH (ref 65–99)
Potassium: 5 mmol/L (ref 3.5–5.1)
Sodium: 137 mmol/L (ref 135–145)
Total Bilirubin: 0.5 mg/dL (ref 0.3–1.2)
Total Protein: 7.6 g/dL (ref 6.5–8.1)

## 2016-12-28 LAB — CBC
HCT: 50.8 % (ref 39.0–52.0)
Hemoglobin: 16.3 g/dL (ref 13.0–17.0)
MCH: 24.8 pg — AB (ref 26.0–34.0)
MCHC: 32.1 g/dL (ref 30.0–36.0)
MCV: 77.3 fL — AB (ref 78.0–100.0)
PLATELETS: 189 10*3/uL (ref 150–400)
RBC: 6.57 MIL/uL — AB (ref 4.22–5.81)
RDW: 14.9 % (ref 11.5–15.5)
WBC: 11.5 10*3/uL — AB (ref 4.0–10.5)

## 2016-12-28 LAB — LIPASE, BLOOD: LIPASE: 13 U/L (ref 11–51)

## 2016-12-28 MED ORDER — ONDANSETRON 4 MG PO TBDP
8.0000 mg | ORAL_TABLET | Freq: Once | ORAL | Status: AC
  Administered 2016-12-28: 8 mg via ORAL

## 2016-12-28 MED ORDER — IOPAMIDOL (ISOVUE-300) INJECTION 61%
INTRAVENOUS | Status: AC
  Administered 2016-12-28: 100 mL
  Filled 2016-12-28: qty 100

## 2016-12-28 MED ORDER — ONDANSETRON 8 MG PO TBDP: 8.0000 mg | ORAL_TABLET | Freq: Three times a day (TID) | ORAL | 0 refills | Status: DC | PRN

## 2016-12-28 MED ORDER — ONDANSETRON 4 MG PO TBDP
ORAL_TABLET | ORAL | Status: AC
  Filled 2016-12-28: qty 2

## 2016-12-28 NOTE — Discharge Instructions (Signed)
You have lower right quadrant rebound tenderness, combined with pain with a straight leg raise. These symptoms are concerning for appendicitis. You need to have an abdominal CT scan, a test that we do not have here in clinic. I am recommending that you be transferred to the emergency room as soon as possible for further evaluation.

## 2016-12-28 NOTE — ED Triage Notes (Signed)
[facility] a [facility] of abdominal cramping and emesis that started early this am.

## 2016-12-28 NOTE — ED Triage Notes (Signed)
Per Pt, [facility] started yesterday along with bilateral lower abdominal pain. Pt denies any urinary symptoms. Was sent down to be evaluated for appendicitis.

## 2016-12-28 NOTE — ED Provider Notes (Signed)
CSN: [id number]     Arrival date & time 12/28/16  1154 History   First MD Initiated Contact with Patient 12/28/16 1328     Chief Complaint  Patient presents with  . Abdominal Cramping  . Emesis   (Consider location/radiation/quality/duration/timing/severity/associated sxs/prior Treatment) 26 year old male presents to clinic for evaluation of nausea, vomiting, diarrhea, abdominal pain, states he woke up with these symptoms this morning, states she's vomited several times, and he has had several episodes of diarrhea. He denies fever, weakness, he has had nothing to eat, and he reports she's had nothing to drink. He reports he still has his gallbladder, and he still has his appendix. Denies fever, chills, weakness, or other symptoms.   The history is provided by the patient.    History reviewed. No pertinent past medical history. Past Surgical History:  Procedure Laterality Date  . left hand surgery     History reviewed. No pertinent family history. Social History  Substance Use Topics  . Smoking status: Current Every Day Smoker    Packs/day: 1.00    Types: Cigarettes  . Smokeless tobacco: Never Used  . Alcohol use No    Review of Systems  Reason unable to perform ROS: As covered in history of present illness.  All other systems reviewed and are negative.   Allergies  Hydrocodone  Home Medications   Prior to Admission medications   Not on File   Meds Ordered and Administered this Visit   Medications  ondansetron (ZOFRAN-ODT) disintegrating tablet 8 mg (8 mg Oral Given 12/28/16 1259)    BP 152/74 (BP Location: Right Arm)   Pulse 89   Temp 98 F (36.7 C) (Oral)   Resp 18   SpO2 100%  No data found.   Physical Exam  Constitutional: He is oriented to person, place, and time. He appears well-developed and well-nourished. He appears distressed.  HENT:  Head: Normocephalic.  Cardiovascular: Normal rate and regular rhythm.   Pulmonary/Chest: Effort normal and breath  sounds normal.  Abdominal: Soft. Normal appearance. Bowel sounds are decreased. There is no hepatosplenomegaly. There is tenderness in the right lower quadrant. There is rebound and tenderness at McBurney's point. There is no CVA tenderness.  Abdominal pain with straight leg raise  Neurological: He is alert and oriented to person, place, and time.  Skin: Skin is warm and dry. Capillary refill takes less than 2 seconds. He is not diaphoretic.  Psychiatric: He has a normal mood and affect. His behavior is normal.  Nursing note and vitals reviewed.   Urgent Care Course     Procedures (including critical care time)  Labs Review Labs Reviewed - No data to display  Imaging Review No results found.    MDM   1. Abdominal pain, vomiting, and diarrhea    You have lower right quadrant rebound tenderness, combined with pain with a straight leg raise. These symptoms are concerning for appendicitis. You need to have an abdominal CT scan, a test that we do not have here in clinic. I am recommending that you be transferred to the emergency room as soon as possible for further evaluation.     Dorena BodoLawrence Tecora Eustache, NP 12/28/16 1436

## 2016-12-28 NOTE — ED Provider Notes (Signed)
[facility]-EMERGENCY DEPT Provider Note   CSN: [id number] Arrival date & time: 12/28/16  1357   By signing my name below, I, [name], attest that this documentation has been prepared under the direction and in the presence of Mancel Bale, MD. Electronically Signed: [name], ED Scribe. 12/28/16. 5:27 PM.  History   Chief Complaint Chief Complaint  Patient presents with  . Abdominal Pain    HPI Craig Graham is a 26 y.o. male who presents to the Emergency Department complaining of RLQ abdominal pain onset 2 AM this morning. He reports associated nausea, vomiting, watery and loose diarrhea x 5 episodes today, and appetite change. He has not tried any medications for the relief of his symptoms. He notes that he was seen [facility] and was informed to come to the ED for further evaluation of his symptoms. He denies fever, chills, and any other symptoms.   Per pt chart review: Pt was seen at [facility] on 12/28/2016 for abdominal pain. Pt was informed to come into the ED for further evaluation and appendicitis rule out.    The history is provided by the patient. No language interpreter was used.    No past medical history on file.  There are no active problems to display for this patient.   Past Surgical History:  Procedure Laterality Date  . left hand surgery         Home Medications    Prior to Admission medications   Medication Sig Start Date End Date Taking? Authorizing Provider  ondansetron (ZOFRAN-ODT) 8 MG disintegrating tablet Take 1 tablet (8 mg total) by mouth every 8 (eight) hours as needed for nausea or vomiting. 12/28/16   Mancel Bale, MD    Family History No family history on file.  Social History Social History  Substance Use Topics  . Smoking status: Current Every Day Smoker    Packs/day: 1.00    Types: Cigarettes  . Smokeless tobacco: Never Used  . Alcohol use No     Allergies   Hydrocodone   Review of Systems Review of  Systems  Constitutional: Positive for appetite change. Negative for chills and fever.  Gastrointestinal: Positive for abdominal pain, diarrhea, nausea and vomiting.  All other systems reviewed and are negative.    Physical Exam Updated Vital Signs BP 143/65 (BP Location: Right Arm)   Pulse 74   Temp 98.4 F (36.9 C) (Oral)   Resp 18   Ht 6' (1.829 m)   Wt 174 lb (78.9 kg)   SpO2 100%   BMI 23.60 kg/m   Physical Exam  Constitutional: He is oriented to person, place, and time. He appears well-developed and well-nourished.  HENT:  Head: Normocephalic and atraumatic.  Right Ear: External ear normal.  Left Ear: External ear normal.  Eyes: Conjunctivae and EOM are normal. Pupils are equal, round, and reactive to light.  Neck: Normal range of motion and phonation normal. Neck supple.  Cardiovascular: Normal rate, regular rhythm and normal heart sounds.   Pulmonary/Chest: Effort normal and breath sounds normal. He exhibits no bony tenderness.  Abdominal: Soft. There is tenderness in the right upper quadrant and right lower quadrant. There is guarding.  Moderate RUQ and RLQ tenderness with guarding noted.   Musculoskeletal: Normal range of motion.  Neurological: He is alert and oriented to person, place, and time. No cranial nerve deficit or sensory deficit. He exhibits normal muscle tone. Coordination normal.  Skin: Skin is warm, dry and intact.  Psychiatric:  He has a normal mood and affect. His behavior is normal. Judgment and thought content normal.  Nursing note and vitals reviewed.    ED Treatments / Results  DIAGNOSTIC STUDIES: Oxygen Saturation is 100% on RA, nl by my interpretation.    COORDINATION OF CARE: 5:12 PM Discussed treatment plan with pt at bedside which includes labs, UA, CT abdomen pelvis, and pt agreed to plan.   Labs (all labs ordered are listed, but only abnormal results are displayed) Labs Reviewed  COMPREHENSIVE METABOLIC PANEL - Abnormal; Notable  for the following:       Result Value   Chloride 98 (*)    Glucose, Bld 102 (*)    BUN 5 (*)    All other components within normal limits  CBC - Abnormal; Notable for the following:    WBC 11.5 (*)    RBC 6.57 (*)    MCV 77.3 (*)    MCH 24.8 (*)    All other components within normal limits  URINALYSIS, ROUTINE W REFLEX MICROSCOPIC - Abnormal; Notable for the following:    Specific Gravity, Urine >1.046 (*)    All other components within normal limits  LIPASE, BLOOD    EKG  EKG Interpretation None       Radiology Ct Abdomen Pelvis W Contrast  Result Date: 12/28/2016 CLINICAL DATA:  26 year old male with right lower abdominal pain, nausea, vomiting and diarrhea since this morning. Initial encounter. EXAM: CT ABDOMEN AND PELVIS WITH CONTRAST TECHNIQUE: Multidetector CT imaging of the abdomen and pelvis was performed using the standard protocol following bolus administration of intravenous contrast. CONTRAST:  100mL ISOVUE-300 IOPAMIDOL (ISOVUE-300) INJECTION 61% COMPARISON:  None. FINDINGS: Lower chest: Lung bases clear. Hepatobiliary: No worrisome hepatic abnormality. No calcified gallstones. Pancreas: No mass, inflammation or pancreatic duct dilation. Spleen: No mass or enlargement. Adrenals/Urinary Tract: Mild fullness right upper pole renal collecting system. There is an adjacent tiny nonobstructing stone. Etiology of the right upper pole renal collecting system fullness is indeterminate. This does not have an appearance of parapelvic cysts, pyelonephritis or small simple cysts. No adrenal mass. Noncontrast filled views the urinary bladder unremarkable. Stomach/Bowel: No inflammation surrounds the appendix. Fluid filled slightly prominent size small bowel loops and fluid-filled right colon. Findings may reflect result of enterocolitis. Portions of stomach under distended without gross abnormality. Vascular/Lymphatic: Major intracranial vascular structures are patent. No aneurysm. No  adenopathy. Reproductive: Negative. Other: No free fluid or free intraperitoneal air. Musculoskeletal: No osseous abnormality. IMPRESSION: No inflammation surrounds the appendix. Fluid filled slightly prominent size small bowel loops and fluid-filled right colon. Findings may reflect result of enterocolitis. Mild fullness right upper pole renal collecting system. There is an adjacent tiny nonobstructing stone. Etiology of the right upper pole renal collecting system fullness is indeterminate. This does not have typical appearance of parapelvic cysts, pyelonephritis or small simple cysts. Electronically Signed   By: Lacy DuverneySteven  Olson M.D.   On: 12/28/2016 18:54    Procedures Procedures (including critical care time)  Medications Ordered in ED Medications  iopamidol (ISOVUE-300) 61 % injection (100 mLs  Contrast Given 12/28/16 1802)     Initial Impression / Assessment and Plan / ED Course  I have reviewed the triage vital signs and the nursing notes.  Pertinent labs & imaging results that were available during my care of the patient were reviewed by me and considered in my medical decision making (see chart for details).     Medications  iopamidol (ISOVUE-300) 61 % injection (100  mLs  Contrast Given 12/28/16 1802)    No data found.   At discharge- reevaluation with update and discussion. After initial assessment and treatment, an updated evaluation reveals he remains comfortable and is very hungry.  No further complaints.  Findings discussed with the patient and all questions were answered. Ry Moody L    Final Clinical Impressions(s) / ED Diagnoses   Final diagnoses:  Nausea vomiting and diarrhea    Nonspecific nausea vomiting and diarrhea, with CT imaging consistent with nonspecific enterocolitis.  No apparent appendicitis.  Nursing Notes Reviewed/ Care Coordinated Applicable Imaging Reviewed Interpretation of Laboratory Data incorporated into ED treatment  The patient appears  reasonably screened and/or stabilized for discharge and I doubt any other medical condition or other [facility] requiring further screening, evaluation, or treatment in the ED at this time prior to discharge.  Plan: Home Medications-OTC as needed; Home Treatments-gradually advance diet; return here if the recommended treatment, does not improve the symptoms; Recommended follow up-PCP, as needed   New Prescriptions Discharge Medication List as of 12/28/2016  7:18 PM    START taking these medications   Details  ondansetron (ZOFRAN-ODT) 8 MG disintegrating tablet Take 1 tablet (8 mg total) by mouth every 8 (eight) hours as needed for nausea or vomiting., Starting Tue 12/28/2016, Print       I personally performed the services described in this documentation, which was scribed in my presence. The recorded information has been reviewed and is accurate.     Mancel Bale, MD 12/30/16 [phone]

## 2016-12-28 NOTE — ED Notes (Signed)
Pt returned from CT °

## 2016-12-28 NOTE — Discharge Instructions (Signed)
Start with a clear liquid diet then increase to bland foods, tomorrow.  For pain or fever, use Tylenol.  Her diarrhea use Kaopectate, or Imodium.  Follow-up with a primary care doctor, if not better in 3 or 4 days.

## 2017-06-18 ENCOUNTER — Emergency Department (HOSPITAL_COMMUNITY)
Admission: EM | Admit: 2017-06-18 | Discharge: 2017-06-18 | Disposition: A | Discharge status: Home / Self Care | Payer: Medicare Other | Attending: Emergency Medicine | Admitting: Emergency Medicine

## 2017-06-18 ENCOUNTER — Emergency Department (HOSPITAL_COMMUNITY): Payer: Medicare Other

## 2017-06-18 ENCOUNTER — Encounter (HOSPITAL_COMMUNITY): Payer: Self-pay | Admitting: *Deleted

## 2017-06-18 DIAGNOSIS — S99922A Unspecified injury of left foot, initial encounter: Secondary | ICD-10-CM | POA: Diagnosis not present

## 2017-06-18 DIAGNOSIS — S199XXA Unspecified injury of neck, initial encounter: Secondary | ICD-10-CM | POA: Diagnosis present

## 2017-06-18 DIAGNOSIS — M542 Cervicalgia: Secondary | ICD-10-CM | POA: Diagnosis not present

## 2017-06-18 DIAGNOSIS — S8002XA Contusion of left knee, initial encounter: Secondary | ICD-10-CM | POA: Diagnosis not present

## 2017-06-18 DIAGNOSIS — Y92410 Unspecified street and highway as the place of occurrence of the external cause: Secondary | ICD-10-CM | POA: Diagnosis not present

## 2017-06-18 DIAGNOSIS — S40012A Contusion of left shoulder, initial encounter: Secondary | ICD-10-CM

## 2017-06-18 DIAGNOSIS — Y999 Unspecified external cause status: Secondary | ICD-10-CM | POA: Insufficient documentation

## 2017-06-18 DIAGNOSIS — M25512 Pain in left shoulder: Secondary | ICD-10-CM | POA: Diagnosis not present

## 2017-06-18 DIAGNOSIS — Y9389 Activity, other specified: Secondary | ICD-10-CM | POA: Diagnosis not present

## 2017-06-18 DIAGNOSIS — S4992XA Unspecified injury of left shoulder and upper arm, initial encounter: Secondary | ICD-10-CM | POA: Diagnosis not present

## 2017-06-18 DIAGNOSIS — S161XXA Strain of muscle, fascia and tendon at neck level, initial encounter: Secondary | ICD-10-CM | POA: Diagnosis not present

## 2017-06-18 DIAGNOSIS — F17210 Nicotine dependence, cigarettes, uncomplicated: Secondary | ICD-10-CM | POA: Insufficient documentation

## 2017-06-18 DIAGNOSIS — S8992XA Unspecified injury of left lower leg, initial encounter: Secondary | ICD-10-CM | POA: Diagnosis not present

## 2017-06-18 DIAGNOSIS — M25562 Pain in left knee: Secondary | ICD-10-CM | POA: Diagnosis not present

## 2017-06-18 MED ORDER — MELOXICAM 7.5 MG PO TABS: 7.5000 mg | ORAL_TABLET | Freq: Every day | ORAL | 0 refills | Status: DC

## 2017-06-18 MED ORDER — CYCLOBENZAPRINE HCL 10 MG PO TABS: 10.0000 mg | ORAL_TABLET | Freq: Two times a day (BID) | ORAL | 0 refills | Status: DC | PRN

## 2017-06-18 MED ORDER — CYCLOBENZAPRINE HCL 10 MG PO TABS
10.0000 mg | ORAL_TABLET | Freq: Once | ORAL | Status: AC
  Administered 2017-06-18: 10 mg via ORAL
  Filled 2017-06-18: qty 1
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
## 2017-08-03 ENCOUNTER — Emergency Department (HOSPITAL_COMMUNITY)
Admission: EM | Admit: 2017-08-03 | Discharge: 2017-08-03 | Disposition: A | Discharge status: Home / Self Care | Payer: No Typology Code available for payment source | Attending: Emergency Medicine | Admitting: Emergency Medicine

## 2017-08-03 ENCOUNTER — Encounter (HOSPITAL_COMMUNITY): Payer: Self-pay | Admitting: Emergency Medicine

## 2017-08-03 DIAGNOSIS — F17210 Nicotine dependence, cigarettes, uncomplicated: Secondary | ICD-10-CM | POA: Diagnosis not present

## 2017-08-03 DIAGNOSIS — Z79899 Other long term (current) drug therapy: Secondary | ICD-10-CM | POA: Insufficient documentation

## 2017-08-03 DIAGNOSIS — Z041 Encounter for examination and observation following transport accident: Secondary | ICD-10-CM | POA: Insufficient documentation
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
## 2017-08-03 NOTE — Discharge Instructions (Signed)
For pain control please take Ibuprofen (also known as Motrin or Advil) 400mg  (this is normally 2 over the counter pills) every 6 hours. Take with food to minimize stomach irritation.  Do not hesitate to return to the emergency room for any new, worsening or concerning symptoms.  Please obtain primary care using resource guide below. Let them know that you were seen in the emergency room and that they will need to obtain records for further outpatient management.

## 2019-12-18 ENCOUNTER — Encounter (HOSPITAL_COMMUNITY): Payer: Self-pay

## 2019-12-18 ENCOUNTER — Ambulatory Visit (HOSPITAL_COMMUNITY)
Admission: EM | Admit: 2019-12-18 | Discharge: 2019-12-18 | Disposition: A | Discharge status: Home / Self Care | Payer: Medicare Other | Attending: Family Medicine | Admitting: Family Medicine

## 2019-12-18 ENCOUNTER — Other Ambulatory Visit: Payer: Self-pay

## 2019-12-18 DIAGNOSIS — Z113 Encounter for screening for infections with a predominantly sexual mode of transmission: Secondary | ICD-10-CM | POA: Diagnosis present

## 2019-12-18 LAB — HIV ANTIBODY (ROUTINE TESTING W REFLEX): HIV Screen 4th Generation wRfx: NONREACTIVE

## 2019-12-18 NOTE — ED Provider Notes (Signed)
[facility]    CSN: [id number] Arrival date & time: 12/18/19  0844      History   Chief Complaint Chief Complaint  Patient presents with  . Exposure to STD    HPI Craig Graham is a 29 y.o. male no significant past medical history presenting today for STD screening.  Patient has a new partner and would like to be screened for STDs prior to become sexually active with this new partner.  He denies any symptoms of dysuria, penile discharge, urinary hesitancy or frequency.  Denies hematuria.  Denies testicle pain or swelling.  Denies rashes or lesions.  Would like to be screened for HIV and syphilis as well.  Denies any specific known exposures, but has heard a rumor of a prior partner having trichomonas recently.   HPI  History reviewed. No pertinent past medical history.  There are no problems to display for this patient.   Past Surgical History:  Procedure Laterality Date  . left hand surgery         Home Medications    Prior to Admission medications   Medication Sig Start Date End Date Taking? Authorizing Provider  cyclobenzaprine (FLEXERIL) 10 MG tablet Take 1 tablet (10 mg total) by mouth 2 (two) times daily as needed for muscle spasms. 06/18/17   Ashley Murrain, NP  meloxicam (MOBIC) 7.5 MG tablet Take 1 tablet (7.5 mg total) by mouth daily. 06/18/17   Ashley Murrain, NP  ondansetron (ZOFRAN-ODT) 8 MG disintegrating tablet Take 1 tablet (8 mg total) by mouth every 8 (eight) hours as needed for nausea or vomiting. 12/28/16   Daleen Bo, MD    Family History History reviewed. No pertinent family history.  Social History Social History   Tobacco Use  . Smoking status: Current Every Day Smoker    Packs/day: 1.00    Types: Cigarettes  . Smokeless tobacco: Never Used  Substance Use Topics  . Alcohol use: No  . Drug use: Yes    Types: Marijuana     Allergies   Hydrocodone   Review of Systems Review of Systems  Constitutional: Negative for  fever.  HENT: Negative for sore throat.   Respiratory: Negative for shortness of breath.   Cardiovascular: Negative for chest pain.  Gastrointestinal: Negative for abdominal pain, nausea and vomiting.  Genitourinary: Negative for difficulty urinating, discharge, dysuria, frequency, penile pain, penile swelling, scrotal swelling and testicular pain.  Skin: Negative for rash.  Neurological: Negative for dizziness, light-headedness and headaches.     Physical Exam Triage Vital Signs ED Triage Vitals  Enc Vitals Group     BP 12/18/19 1006 139/83     Pulse Rate 12/18/19 1006 63     Resp 12/18/19 1006 18     Temp 12/18/19 1006 98.1 F (36.7 C)     Temp src --      SpO2 12/18/19 1006 100 %     Weight --      Height --      Head Circumference --      Peak Flow --      Pain Score 12/18/19 1005 0     Pain Loc --      Pain Edu? --      Excl. in [place]? --    No data found.  Updated Vital Signs BP 139/83 (BP Location: Right Arm)   Pulse 63   Temp 98.1 F (36.7 C)   Resp 18   SpO2 100%   Visual  Acuity Right Eye Distance:   Left Eye Distance:   Bilateral Distance:    Right Eye Near:   Left Eye Near:    Bilateral Near:     Physical Exam Vitals and nursing note reviewed.  Constitutional:      Appearance: He is well-developed.     Comments: No acute distress  HENT:     Head: Normocephalic and atraumatic.     Nose: Nose normal.  Eyes:     Conjunctiva/sclera: Conjunctivae normal.  Cardiovascular:     Rate and Rhythm: Normal rate.  Pulmonary:     Effort: Pulmonary effort is normal. No respiratory distress.  Abdominal:     General: There is no distension.  Musculoskeletal:        General: Normal range of motion.     Cervical back: Neck supple.  Skin:    General: Skin is warm and dry.  Neurological:     Mental Status: He is alert and oriented to person, place, and time.      UC Treatments / Results  Labs (all labs ordered are listed, but only abnormal results are  displayed) Labs Reviewed  HIV ANTIBODY (ROUTINE TESTING W REFLEX)  RPR  CYTOLOGY, (ORAL, ANAL, URETHRAL) ANCILLARY ONLY    EKG   Radiology No results found.  Procedures Procedures (including critical care time)  Medications Ordered in UC Medications - No data to display  Initial Impression / Assessment and Plan / UC Course  I have reviewed the triage vital signs and the nursing notes.  Pertinent labs & imaging results that were available during my care of the patient were reviewed by me and considered in my medical decision making (see chart for details).     Urethral cytology swab obtained to check for gonorrhea, chlamydia and trichomonas.  HIV and RPR pending.  Currently asymptomatic, deferring empiric treatment.  Will call with results and provide further treatment as needed.  Discussed strict return precautions. Patient verbalized understanding and is agreeable with plan.  Final Clinical Impressions(s) / UC Diagnoses   Final diagnoses:  Screen for STD (sexually transmitted disease)     Discharge Instructions     We are testing you for HIV, Syphillis, Gonorrhea, Chlamydia and Trichomonas. We will call you if anything is positive and let you know if you require any further treatment. Please inform partner of any positive results.  Please return if symptoms not improving with treatment, development of fever, nausea, vomiting, abdominal pain, scrotal pain.    ED Prescriptions    None     PDMP not reviewed this encounter.   Zemirah Krasinski, [place] C, PA-C 12/18/19 1046

## 2019-12-18 NOTE — Discharge Instructions (Signed)
We are testing you for HIV, Syphillis,  Gonorrhea, Chlamydia and Trichomonas. We will call you if anything is positive and let you know if you require any further treatment. Please inform partner of any positive results. ° °Please return if symptoms not improving with treatment, development of fever, nausea, vomiting, abdominal pain, scrotal pain. °

## 2019-12-18 NOTE — ED Triage Notes (Signed)
Pt would like to get tested for std, he recently switched partners and just wanted to get check out for any STD. Pt denies any symptoms at this time.

## 2019-12-19 LAB — RPR: RPR Ser Ql: NONREACTIVE

## 2019-12-19 LAB — CYTOLOGY, (ORAL, ANAL, URETHRAL) ANCILLARY ONLY
Chlamydia: NEGATIVE
Neisseria Gonorrhea: NEGATIVE
Trichomonas: POSITIVE — AB

## 2019-12-21 ENCOUNTER — Telehealth (HOSPITAL_COMMUNITY): Payer: Self-pay | Admitting: Emergency Medicine

## 2019-12-21 MED ORDER — METRONIDAZOLE 500 MG PO TABS: 2000.0000 mg | ORAL_TABLET | Freq: Once | ORAL | 0 refills | Status: AC

## 2019-12-21 NOTE — Telephone Encounter (Signed)
STD screening postive for Trich. One time dose of 2 g  Flagyl sent to [facility] on [address]. No intercourse x 1 week.   Attempted to call, left voicemail to return call for results.

## 2019-12-24 ENCOUNTER — Telehealth (HOSPITAL_COMMUNITY): Payer: Self-pay | Admitting: Emergency Medicine

## 2019-12-24 NOTE — Telephone Encounter (Signed)
Patient contacted by phone and made aware of  cytology  results and medicine at pharmacy. Pt verbalized understanding and had all questions answered.

## 2020-06-24 ENCOUNTER — Encounter (HOSPITAL_COMMUNITY): Payer: Self-pay

## 2020-06-24 ENCOUNTER — Ambulatory Visit (HOSPITAL_COMMUNITY)
Admission: EM | Admit: 2020-06-24 | Discharge: 2020-06-24 | Disposition: A | Discharge status: Home / Self Care | Payer: Medicare Other | Attending: Physician Assistant | Admitting: Physician Assistant

## 2020-06-24 ENCOUNTER — Other Ambulatory Visit: Payer: Self-pay

## 2020-06-24 DIAGNOSIS — M7742 Metatarsalgia, left foot: Secondary | ICD-10-CM | POA: Diagnosis not present

## 2020-06-24 MED ORDER — NAPROXEN 500 MG PO TABS: 500.0000 mg | ORAL_TABLET | Freq: Two times a day (BID) | ORAL | 0 refills | Status: DC

## 2020-06-24 MED ORDER — ACETAMINOPHEN 325 MG PO TABS: 650.0000 mg | ORAL_TABLET | Freq: Four times a day (QID) | ORAL | 0 refills | Status: DC | PRN

## 2020-06-24 NOTE — ED Provider Notes (Signed)
[facility]    CSN: [id number] Arrival date & time: 06/24/20  6222      History   Chief Complaint Chief Complaint  Patient presents with  . Foot Pain    HPI Craig Graham is a 29 y.o. male.   Patient presents for left foot pain.  Reports pain is in the bottom of the left foot.  Reports the pain starts in the middle foot and goes up through his toes.  Reports pain primarily started this morning.  He noticed a little bit of pain when he stepped out of bed and then when he put on his shoe it hurt a lot worse.  He reports he works on his feet all day wearing boots.  Denies any injuries that he knows of.  He does note some spots showing up on his feet.  He is unsure if these are causing the pain.  They're not particularly painful.     No past medical history on file.  There are no problems to display for this patient.   Past Surgical History:  Procedure Laterality Date  . left hand surgery         Home Medications    Prior to Admission medications   Medication Sig Start Date End Date Taking? Authorizing Provider  acetaminophen (TYLENOL) 325 MG tablet Take 2 tablets (650 mg total) by mouth every 6 (six) hours as needed. 06/24/20   Daisha Filosa, Veryl Speak, PA-C  cyclobenzaprine (FLEXERIL) 10 MG tablet Take 1 tablet (10 mg total) by mouth 2 (two) times daily as needed for muscle spasms. 06/18/17   Janne Napoleon, NP  meloxicam (MOBIC) 7.5 MG tablet Take 1 tablet (7.5 mg total) by mouth daily. 06/18/17   Janne Napoleon, NP  naproxen (NAPROSYN) 500 MG tablet Take 1 tablet (500 mg total) by mouth 2 (two) times daily. 06/24/20   Lynise Porr, Veryl Speak, PA-C  ondansetron (ZOFRAN-ODT) 8 MG disintegrating tablet Take 1 tablet (8 mg total) by mouth every 8 (eight) hours as needed for nausea or vomiting. 12/28/16   Mancel Bale, MD    Family History Family History  Problem Relation Age of Onset  . COPD Mother   . Diabetes Father   . Liver cancer Father     Social History Social History    Tobacco Use  . Smoking status: Current Every Day Smoker    Packs/day: 1.00    Types: Cigarettes  . Smokeless tobacco: Never Used  Substance Use Topics  . Alcohol use: No  . Drug use: Yes    Types: Marijuana     Allergies   Hydrocodone   Review of Systems Review of Systems   Physical Exam Triage Vital Signs ED Triage Vitals  Enc Vitals Group     BP 06/24/20 1205 (!) 146/83     Pulse Rate 06/24/20 1205 62     Resp 06/24/20 1205 18     Temp 06/24/20 1205 98.4 F (36.9 C)     Temp Source 06/24/20 1205 Oral     SpO2 06/24/20 1205 100 %     Weight --      Height --      Head Circumference --      Peak Flow --      Pain Score 06/24/20 1202 10     Pain Loc --      Pain Edu? --      Excl. in [place]? --    No data found.  Updated Vital Signs  BP (!) 146/83 (BP Location: Right Arm)   Pulse 62   Temp 98.4 F (36.9 C) (Oral)   Resp 18   SpO2 100%   Visual Acuity Right Eye Distance:   Left Eye Distance:   Bilateral Distance:    Right Eye Near:   Left Eye Near:    Bilateral Near:     Physical Exam Vitals and nursing note reviewed.  Constitutional:      Appearance: Normal appearance.  Musculoskeletal:     Comments: Left foot deformity, swelling or edema.  No erythema.  A corn is located on the lateral aspect foot, proximal plantar and distal plantar surface.  No tenderness to palpation directly in these areas.  There is significant tenderness to palpation starting in the mid plantar surface through the heads of the metatarsals and including the phalanges.  No dorsal tenderness.  Full range of motion all the digits.  Able to bear weight, with break primarily on the lateral edge of plantar surface.  Cap refill less than 2 seconds.  Sensation is intact.  Dorsalis pedis 2+.  Neurological:     Mental Status: He is alert.      UC Treatments / Results  Labs (all labs ordered are listed, but only abnormal results are displayed) Labs Reviewed - No data to  display  EKG   Radiology No results found.  Procedures Procedures (including critical care time)  Medications Ordered in UC Medications - No data to display  Initial Impression / Assessment and Plan / UC Course  I have reviewed the triage vital signs and the nursing notes.  Pertinent labs & imaging results that were available during my care of the patient were reviewed by me and considered in my medical decision making (see chart for details).    #Metatarsalgia Patient is a 29 year old presenting with what appears to be a metatarsalgia.  No trauma or obvious cause at this point.  Will place in a cam boot for comfort send out with naproxen.  We'll have him follow-up with sports medicine.  Work note given. Final Clinical Impressions(s) / UC Diagnoses   Final diagnoses:  Metatarsalgia of left foot     Discharge Instructions     Take the medications as prescribed Use boot  Call Dr.schmitz office today for close follow up  Ice and elevate    ED Prescriptions    Medication Sig Dispense Auth. Provider   naproxen (NAPROSYN) 500 MG tablet Take 1 tablet (500 mg total) by mouth 2 (two) times daily. 30 tablet Charlee Squibb, Veryl Speak, PA-C   acetaminophen (TYLENOL) 325 MG tablet Take 2 tablets (650 mg total) by mouth every 6 (six) hours as needed. 30 tablet Brucha Ahlquist, Veryl Speak, PA-C     PDMP not reviewed this encounter.   Hermelinda Medicus, PA-C 06/24/20 2255

## 2020-06-24 NOTE — ED Triage Notes (Signed)
Pt is here with left foot pain that started this morning, pt has not taken any meds to relieve discomfort.

## 2020-06-24 NOTE — Discharge Instructions (Signed)
Take the medications as prescribed Use boot  Call Dr.schmitz office today for close follow up  Ice and elevate

## 2020-06-25 ENCOUNTER — Encounter: Payer: Self-pay | Admitting: Family Medicine

## 2020-06-25 ENCOUNTER — Ambulatory Visit: Payer: Self-pay

## 2020-06-25 ENCOUNTER — Ambulatory Visit (INDEPENDENT_AMBULATORY_CARE_PROVIDER_SITE_OTHER): Payer: Medicare Other | Admitting: Family Medicine

## 2020-06-25 VITALS — BP 135/88 | HR 73 | Ht 70.0 in | Wt 162.0 lb

## 2020-06-25 DIAGNOSIS — M79672 Pain in left foot: Secondary | ICD-10-CM

## 2020-06-25 DIAGNOSIS — M795 Residual foreign body in soft tissue: Secondary | ICD-10-CM | POA: Diagnosis not present

## 2020-06-25 NOTE — Progress Notes (Signed)
Craig Graham - 29 y.o. male MRN [id number]  Date of birth: Jun 05, 1991  SUBJECTIVE:  Including CC & ROS.  Chief Complaint  Patient presents with  . Foot Pain    left x 1 week    Craig Graham is a 29 y.o. male that is presenting with left foot pain.  He has 3 different areas of callus formation on the plantar aspect of his foot.  He denies any discharge.  There are severely painful to touch.  Denies any redness or warmth associated with the area.  Denies any trauma or inciting event.  Has tried naproxen with little to no improvement.  No history of surgery.  No history of similar pain..    Review of Systems See HPI   HISTORY: Past Medical, Surgical, Social, and Family History Reviewed & Updated per EMR.   Pertinent Historical Findings include:  No past medical history on file.  Past Surgical History:  Procedure Laterality Date  . left hand surgery      Family History  Problem Relation Age of Onset  . COPD Mother   . Diabetes Father   . Liver cancer Father     Social History   Socioeconomic History  . Marital status: Significant Other    Spouse name: Not on file  . Number of children: Not on file  . Years of education: Not on file  . Highest education level: Not on file  Occupational History  . Not on file  Tobacco Use  . Smoking status: Current Every Day Smoker    Packs/day: 1.00    Types: Cigarettes  . Smokeless tobacco: Never Used  Substance and Sexual Activity  . Alcohol use: No  . Drug use: Yes    Types: Marijuana  . Sexual activity: Yes    Birth control/protection: Condom  Other Topics Concern  . Not on file  Social History Narrative  . Not on file   Social Determinants of Health   Financial Resource Strain:   . Difficulty of Paying Living Expenses: Not on file  Food Insecurity:   . Worried About Programme researcher, broadcasting/film/video in the Last Year: Not on file  . Ran Out of Food in the Last Year: Not on file  Transportation Needs:   . Lack of  Transportation (Medical): Not on file  . Lack of Transportation (Non-Medical): Not on file  Physical Activity:   . Days of Exercise per Week: Not on file  . Minutes of Exercise per Session: Not on file  Stress:   . Feeling of Stress : Not on file  Social Connections:   . Frequency of Communication with Friends and Family: Not on file  . Frequency of Social Gatherings with Friends and Family: Not on file  . Attends Religious Services: Not on file  . Active Member of Clubs or Organizations: Not on file  . Attends Banker Meetings: Not on file  . Marital Status: Not on file  Intimate Partner Violence:   . Fear of Current or Ex-Partner: Not on file  . Emotionally Abused: Not on file  . Physically Abused: Not on file  . Sexually Abused: Not on file     PHYSICAL EXAM:  VS: BP 135/88   Pulse 73   Ht 5\' 10"  (1.778 m)   Wt 162 lb (73.5 kg)   BMI 23.24 kg/m  Physical Exam Gen: NAD, alert, cooperative with exam, well-appearing MSK:  Left foot: Callus appreciated at the base of the fifth.  Callus ration appreciated on the plantar aspect of the fifth metatarsal head And a callus formation appreciated in the mid arch. These are tender to touch. No redness or erythema or discharge. Neurovascularly intact  Limited ultrasound: Left foot:  Callus appreciated with no foreign body at the base of the fifth, head of the fifth on the plantar aspect and in the mid arch. No cobblestoning or abscess apparent  Summary: Findings would suggest callus with an currently small foreign body.  Ultrasound and interpretation by Clare Gandy, MD  Incision and Drainage Procedure Note: Callus at the base of the fifth metatarsal. Callus of the head of the plantar aspect of the fifth metatarsal. Callus of the midfoot longitudinal arch  The affected area was cleaned and draped in a sterile fashion. Anesthesia was achieved using 2 mL of 1% Lidocaine without epinephrine injected around the  callus area using a 25-guage 1.5 inch needle. An 15-blade scalpel was used to incise the callus.  It was debrided until a vascular tissue was achieved. The patient tolerated the procedure well. No complications were encountered.    ASSESSMENT & PLAN:   Left foot pain He has 3 different areas of pain on his left foot.  Ultrasound was unrevealing for any significant large foreign body.  Debridement of the area shows here in these 3 different areas of callus. -Continue cam walker. -Counseled on supportive care. -Counseled on management.  Foreign body (FB) in soft tissue The 3 different areas were debrided today.  Hair is likely responsible for the different areas of callus formation. -Counseled on close follow-up.

## 2020-06-25 NOTE — Patient Instructions (Signed)
Nice to meet you Please warm the foot with warm soapy water.   Please send me a message in MyChart with any questions or updates.  Please see me back in 2 days.    --Dr. Jordan Likes

## 2020-06-25 NOTE — Assessment & Plan Note (Signed)
He has 3 different areas of pain on his left foot.  Ultrasound was unrevealing for any significant large foreign body.  Debridement of the area shows here in these 3 different areas of callus. -Continue cam walker. -Counseled on supportive care. -Counseled on management.

## 2020-06-25 NOTE — Assessment & Plan Note (Signed)
The 3 different areas were debrided today.  Hair is likely responsible for the different areas of callus formation. -Counseled on close follow-up.

## 2020-06-26 DIAGNOSIS — S99922D Unspecified injury of left foot, subsequent encounter: Secondary | ICD-10-CM | POA: Diagnosis not present

## 2020-06-27 ENCOUNTER — Other Ambulatory Visit: Payer: Self-pay

## 2020-06-27 ENCOUNTER — Encounter: Payer: Self-pay | Admitting: Family Medicine

## 2020-06-27 ENCOUNTER — Ambulatory Visit (INDEPENDENT_AMBULATORY_CARE_PROVIDER_SITE_OTHER): Payer: Medicare Other | Admitting: Family Medicine

## 2020-06-27 DIAGNOSIS — M795 Residual foreign body in soft tissue: Secondary | ICD-10-CM | POA: Diagnosis present

## 2020-06-27 NOTE — Assessment & Plan Note (Signed)
The areas that were debrided appeared to be healing with no signs of infection. -Counseled on supportive care. -Counseled on coming out of the Cam walker. -Can follow-up as needed.

## 2020-06-27 NOTE — Progress Notes (Signed)
Craig Graham - 29 y.o. male MRN [id number]  Date of birth: [birth year]  SUBJECTIVE:  Including CC & ROS.  Chief Complaint  Patient presents with  . Follow-up    left foot    Craig Graham is a 29 y.o. male that is following up for his left foot pain.  He has been doing well and changing the bandages.  His pain is slowly improving.  He still feels more comfortable using the cam walker.   Review of Systems See HPI   HISTORY: Past Medical, Surgical, Social, and Family History Reviewed & Updated per EMR.   Pertinent Historical Findings include:  No past medical history on file.  Past Surgical History:  Procedure Laterality Date  . left hand surgery      Family History  Problem Relation Age of Onset  . COPD Mother   . Diabetes Father   . Liver cancer Father     Social History   Socioeconomic History  . Marital status: Significant Other    Spouse name: Not on file  . Number of children: Not on file  . Years of education: Not on file  . Highest education level: Not on file  Occupational History  . Not on file  Tobacco Use  . Smoking status: Current Every Day Smoker    Packs/day: 1.00    Types: Cigarettes  . Smokeless tobacco: Never Used  Substance and Sexual Activity  . Alcohol use: No  . Drug use: Yes    Types: Marijuana  . Sexual activity: Yes    Birth control/protection: Condom  Other Topics Concern  . Not on file  Social History Narrative  . Not on file   Social Determinants of Health   Financial Resource Strain:   . Difficulty of Paying Living Expenses: Not on file  Food Insecurity:   . Worried About Programme researcher, broadcasting/film/video in the Last Year: Not on file  . Ran Out of Food in the Last Year: Not on file  Transportation Needs:   . Lack of Transportation (Medical): Not on file  . Lack of Transportation (Non-Medical): Not on file  Physical Activity:   . Days of Exercise per Week: Not on file  . Minutes of Exercise per Session: Not on file  Stress:     . Feeling of Stress : Not on file  Social Connections:   . Frequency of Communication with Friends and Family: Not on file  . Frequency of Social Gatherings with Friends and Family: Not on file  . Attends Religious Services: Not on file  . Active Member of Clubs or Organizations: Not on file  . Attends Banker Meetings: Not on file  . Marital Status: Not on file  Intimate Partner Violence:   . Fear of Current or Ex-Partner: Not on file  . Emotionally Abused: Not on file  . Physically Abused: Not on file  . Sexually Abused: Not on file     PHYSICAL EXAM:  VS: BP (!) 147/89   Pulse 67   Ht 5\' 10"  (1.778 m)   Wt 162 lb (73.5 kg)   BMI 23.24 kg/m  Physical Exam Gen: NAD, alert, cooperative with exam, well-appearing MSK:  Left foot: Healing areas that were debrided. No streaking or redness. Neurovascularly intact     ASSESSMENT & PLAN:   Foreign body (FB) in soft tissue The areas that were debrided appeared to be healing with no signs of infection. -Counseled on supportive care. -Counseled on coming  out of the Cam walker. -Can follow-up as needed.

## 2020-06-28 ENCOUNTER — Other Ambulatory Visit: Payer: Self-pay

## 2020-06-28 ENCOUNTER — Encounter (HOSPITAL_COMMUNITY): Payer: Self-pay

## 2020-06-28 ENCOUNTER — Ambulatory Visit (HOSPITAL_COMMUNITY)
Admission: EM | Admit: 2020-06-28 | Discharge: 2020-06-28 | Disposition: A | Discharge status: Home / Self Care | Payer: Medicare Other

## 2020-06-28 DIAGNOSIS — S81812A Laceration without foreign body, left lower leg, initial encounter: Secondary | ICD-10-CM

## 2020-06-28 DIAGNOSIS — Z23 Encounter for immunization: Secondary | ICD-10-CM | POA: Diagnosis not present

## 2020-06-28 MED ORDER — TETANUS-DIPHTH-ACELL PERTUSSIS 5-2.5-18.5 LF-MCG/0.5 IM SUSP
INTRAMUSCULAR | Status: AC
  Filled 2020-06-28: qty 0.5

## 2020-06-28 MED ORDER — TETANUS-DIPHTH-ACELL PERTUSSIS 5-2.5-18.5 LF-MCG/0.5 IM SUSP
0.5000 mL | Freq: Once | INTRAMUSCULAR | Status: AC
  Administered 2020-06-28: 0.5 mL via INTRAMUSCULAR

## 2020-06-28 NOTE — ED Triage Notes (Addendum)
Pt states he was mowing the grass and the blade came from under the lawn mower and lacerated his LLE today. Pt has shirt wrapped around lac. Bleeding is controlled.

## 2020-06-28 NOTE — Discharge Instructions (Addendum)
Keep your wound clean and dry.  Wash it gently twice a day with soap and water.  Apply an antibiotic cream twice a day for 2-3 days.    Return here for removal of your stitches in 7 to 10 days.  Return here sooner if you see signs of infection, such as increased pain, redness, pus-like drainage, warmth, fever, chills, or other concerning symptoms.

## 2020-06-28 NOTE — ED Provider Notes (Signed)
[facility]    CSN: [id number] Arrival date & time: 06/28/20  1116      History   Chief Complaint Chief Complaint  Patient presents with  . Extremity Laceration    HPI Craig Graham is a 29 y.o. male.  Patient presents with a laceration on his left lower leg which occurred today while he was mowing the grass. He states he was attempting to change the blade when it rotated and cut his lower leg. Bleeding controlled with direct pressure. He denies numbness, weakness, paresthesias. Last tetanus unknown.  The history is provided by the patient.    History reviewed. No pertinent past medical history.  Patient Active Problem List   Diagnosis Date Noted  . Left foot pain 06/25/2020  . Foreign body (FB) in soft tissue 06/25/2020    Past Surgical History:  Procedure Laterality Date  . FOOT SURGERY Left   . left hand surgery         Home Medications    Prior to Admission medications   Medication Sig Start Date End Date Taking? Authorizing Provider  acetaminophen (TYLENOL) 325 MG tablet Take 2 tablets (650 mg total) by mouth every 6 (six) hours as needed. 06/24/20   Darr, Veryl Speak, PA-C  cyclobenzaprine (FLEXERIL) 10 MG tablet Take 1 tablet (10 mg total) by mouth 2 (two) times daily as needed for muscle spasms. 06/18/17   Janne Napoleon, NP  meloxicam (MOBIC) 7.5 MG tablet Take 1 tablet (7.5 mg total) by mouth daily. 06/18/17   Janne Napoleon, NP  naproxen (NAPROSYN) 500 MG tablet Take 1 tablet (500 mg total) by mouth 2 (two) times daily. 06/24/20   Darr, Veryl Speak, PA-C  ondansetron (ZOFRAN-ODT) 8 MG disintegrating tablet Take 1 tablet (8 mg total) by mouth every 8 (eight) hours as needed for nausea or vomiting. 12/28/16   Mancel Bale, MD  traMADol (ULTRAM) 50 MG tablet Take 50 mg by mouth every 6 (six) hours. 06/26/20   [provider]    Family History Family History  Problem Relation Age of Onset  . COPD Mother   . Diabetes Father   . Liver cancer Father       Social History Social History   Tobacco Use  . Smoking status: Current Every Day Smoker    Packs/day: 1.00    Types: Cigarettes  . Smokeless tobacco: Never Used  Substance Use Topics  . Alcohol use: No  . Drug use: Not Currently    Types: Marijuana     Allergies   Hydrocodone   Review of Systems Review of Systems  Constitutional: Negative for chills and fever.  HENT: Negative for ear pain and sore throat.   Eyes: Negative for pain and visual disturbance.  Respiratory: Negative for cough and shortness of breath.   Cardiovascular: Negative for chest pain and palpitations.  Gastrointestinal: Negative for abdominal pain and vomiting.  Genitourinary: Negative for dysuria and hematuria.  Musculoskeletal: Negative for arthralgias and back pain.  Skin: Positive for wound. Negative for color change and rash.  Neurological: Negative for seizures, syncope, weakness and numbness.  All other systems reviewed and are negative.    Physical Exam Triage Vital Signs ED Triage Vitals  Enc Vitals Group     BP 06/28/20 1235 120/78     Pulse Rate 06/28/20 1235 79     Resp 06/28/20 1235 16     Temp 06/28/20 1235 98.7 F (37.1 C)     Temp Source 06/28/20 1235  Oral     SpO2 06/28/20 1235 100 %     Weight 06/28/20 1236 155 lb (70.3 kg)     Height 06/28/20 1236 5\' 10"  (1.778 m)     Head Circumference --      Peak Flow --      Pain Score 06/28/20 1235 10     Pain Loc --      Pain Edu? --      Excl. in GC? --    No data found.  Updated Vital Signs BP 120/78   Pulse 79   Temp 98.7 F (37.1 C) (Oral)   Resp 16   Ht 5\' 10"  (1.778 m)   Wt 155 lb (70.3 kg)   SpO2 100%   BMI 22.24 kg/m   Visual Acuity Right Eye Distance:   Left Eye Distance:   Bilateral Distance:    Right Eye Near:   Left Eye Near:    Bilateral Near:     Physical Exam Vitals and nursing note reviewed.  Constitutional:      General: He is not in acute distress.    Appearance: He is well-developed.   HENT:     Head: Normocephalic and atraumatic.     Mouth/Throat:     Mouth: Mucous membranes are moist.  Eyes:     Conjunctiva/sclera: Conjunctivae normal.  Cardiovascular:     Rate and Rhythm: Normal rate and regular rhythm.     Heart sounds: No murmur heard.   Pulmonary:     Effort: Pulmonary effort is normal. No respiratory distress.     Breath sounds: Normal breath sounds.  Abdominal:     Palpations: Abdomen is soft.     Tenderness: There is no abdominal tenderness.  Musculoskeletal:        General: No swelling or deformity. Normal range of motion.     Cervical back: Neck supple.  Skin:    General: Skin is warm and dry.     Capillary Refill: Capillary refill takes less than 2 seconds.     Comments: 3 cm laceration on left lower leg.  Neurological:     General: No focal deficit present.     Mental Status: He is alert and oriented to person, place, and time.     Sensory: No sensory deficit.     Motor: No weakness.  Psychiatric:        Mood and Affect: Mood normal.        Behavior: Behavior normal.      UC Treatments / Results  Labs (all labs ordered are listed, but only abnormal results are displayed) Labs Reviewed - No data to display  EKG   Radiology No results found.  Procedures Laceration Repair  Date/Time: 06/28/2020 1:17 PM Performed by: , NP Authorized by: 08/28/2020, NP   Consent:    Consent obtained:  Verbal   Consent given by:  Patient   Risks discussed:  Infection, pain, poor wound healing and poor cosmetic result Laceration details:    Location:  Leg   Leg location:  L lower leg   Length (cm):  3   Depth (mm):  3 Repair type:    Repair type:  Simple Exploration:    Hemostasis achieved with:  Direct pressure   Wound exploration: wound explored through full range of motion and entire depth of wound probed and visualized     Wound extent: no foreign bodies/material noted, no muscle damage noted and no tendon damage noted  Treatment:    Area cleansed with:  Shur-Clens   Amount of cleaning:  Standard   Irrigation solution:  Sterile saline   Irrigation method:  Syringe   Visualized foreign bodies/material removed: no   Skin repair:    Repair method:  Sutures   Suture size:  4-0   Suture material:  Nylon   Suture technique:  Simple interrupted   Number of sutures:  6 Approximation:    Approximation:  Close Post-procedure details:    Dressing:  Antibiotic ointment and non-adherent dressing   Patient tolerance of procedure:  Tolerated well, no immediate complications   (including critical care time)  Medications Ordered in UC Medications  Tdap (BOOSTRIX) injection 0.5 mL (0.5 mLs Intramuscular Given 06/28/20 1316)    Initial Impression / Assessment and Plan / UC Course  I have reviewed the triage vital signs and the nursing notes.  Pertinent labs & imaging results that were available during my care of the patient were reviewed by me and considered in my medical decision making (see chart for details).   Laceration of left lower leg. Tetanus updated. 6 sutures. Wound care instructions and signs of infection discussed with patient. Instructed him to return here for suture removal in 7 to 10 days. Instructed him to return sooner if he notes signs of infection. Patient agrees to plan of care.   Final Clinical Impressions(s) / UC Diagnoses   Final diagnoses:  Laceration of left lower leg, initial encounter     Discharge Instructions     Keep your wound clean and dry.  Wash it gently twice a day with soap and water.  Apply an antibiotic cream twice a day for 2-3 days.    Return here for removal of your stitches in 7 to 10 days.  Return here sooner if you see signs of infection, such as increased pain, redness, pus-like drainage, warmth, fever, chills, or other concerning symptoms.       ED Prescriptions    None     PDMP not reviewed this encounter.   Mickie Bail, NP 06/28/20 1319

## 2020-07-13 ENCOUNTER — Other Ambulatory Visit: Payer: Self-pay

## 2020-07-13 ENCOUNTER — Encounter (HOSPITAL_COMMUNITY): Payer: Self-pay | Admitting: Emergency Medicine

## 2020-07-13 ENCOUNTER — Ambulatory Visit (HOSPITAL_COMMUNITY)
Admission: EM | Admit: 2020-07-13 | Discharge: 2020-07-13 | Disposition: A | Discharge status: Home / Self Care | Payer: Medicare Other

## 2020-07-13 DIAGNOSIS — Z4802 Encounter for removal of sutures: Secondary | ICD-10-CM

## 2020-07-13 NOTE — ED Triage Notes (Signed)
Sutures placed to left lower leg on 06/28/2020.  Wound is healing well.  Edges of wound are not flush to each other, but area between edges appears healed. Surrounding skin unremarkable.   Tresa Endo, NP did look at wound in intake prior to suture removal.  Go ahead with plan to remove sutures.

## 2020-10-31 ENCOUNTER — Encounter (HOSPITAL_COMMUNITY): Payer: Self-pay | Admitting: *Deleted

## 2020-10-31 ENCOUNTER — Ambulatory Visit (HOSPITAL_COMMUNITY)
Admission: EM | Admit: 2020-10-31 | Discharge: 2020-10-31 | Disposition: A | Discharge status: Home / Self Care | Payer: Medicare Other | Attending: Emergency Medicine | Admitting: Emergency Medicine

## 2020-10-31 ENCOUNTER — Other Ambulatory Visit: Payer: Self-pay

## 2020-10-31 DIAGNOSIS — J069 Acute upper respiratory infection, unspecified: Secondary | ICD-10-CM | POA: Diagnosis not present

## 2020-10-31 DIAGNOSIS — Z20822 Contact with and (suspected) exposure to covid-19: Secondary | ICD-10-CM | POA: Diagnosis not present

## 2020-10-31 NOTE — ED Triage Notes (Signed)
Pt reports starting Yesterday he had a cough ,sore throat and generlized body aches.

## 2020-10-31 NOTE — ED Provider Notes (Signed)
[facility]    CSN: [id number] Arrival date & time: 10/31/20  1653      History   Chief Complaint Chief Complaint  Patient presents with  . Fever  . Generalized Body Aches  . Nasal Congestion  . Sore Throat    HPI Craig Graham is a 30 y.o. male.   HPI Craig Graham is a 30 y.o. male presenting to UC with c/o cough, congestion, body aches, sore throat, and temp of 99.9*F since yesterday. His 3yo son tested positive for COVID earlier in the week. Pt shares custody of his son but has been around his son recently. Pt is not vaccinated for COVID. Denies chest pain or SOB. No hx of asthma.   History reviewed. No pertinent past medical history.  Patient Active Problem List   Diagnosis Date Noted  . Left foot pain 06/25/2020  . Foreign body (FB) in soft tissue 06/25/2020    Past Surgical History:  Procedure Laterality Date  . FOOT SURGERY Left   . left hand surgery         Home Medications    Prior to Admission medications   Medication Sig Start Date End Date Taking? Authorizing Provider  acetaminophen (TYLENOL) 325 MG tablet Take 2 tablets (650 mg total) by mouth every 6 (six) hours as needed. 06/24/20   Darr, Gerilyn Pilgrim, PA-C  cyclobenzaprine (FLEXERIL) 10 MG tablet Take 1 tablet (10 mg total) by mouth 2 (two) times daily as needed for muscle spasms. 06/18/17   Janne Napoleon, NP  meloxicam (MOBIC) 7.5 MG tablet Take 1 tablet (7.5 mg total) by mouth daily. 06/18/17   Janne Napoleon, NP  naproxen (NAPROSYN) 500 MG tablet Take 1 tablet (500 mg total) by mouth 2 (two) times daily. 06/24/20   Darr, Gerilyn Pilgrim, PA-C  ondansetron (ZOFRAN-ODT) 8 MG disintegrating tablet Take 1 tablet (8 mg total) by mouth every 8 (eight) hours as needed for nausea or vomiting. 12/28/16   Mancel Bale, MD  traMADol (ULTRAM) 50 MG tablet Take 50 mg by mouth every 6 (six) hours. 06/26/20   [provider]    Family History Family History  Problem Relation Age of Onset  . COPD Mother    . Diabetes Father   . Liver cancer Father     Social History Social History   Tobacco Use  . Smoking status: Current Every Day Smoker    Packs/day: 1.00    Types: Cigarettes  . Smokeless tobacco: Never Used  Substance Use Topics  . Alcohol use: No  . Drug use: Not Currently    Types: Marijuana     Allergies   Hydrocodone   Review of Systems Review of Systems  Constitutional: Positive for fever (low grade). Negative for chills.  HENT: Positive for congestion and sore throat. Negative for ear pain, trouble swallowing and voice change.   Respiratory: Positive for cough. Negative for shortness of breath.   Cardiovascular: Negative for chest pain and palpitations.  Gastrointestinal: Negative for abdominal pain, diarrhea, nausea and vomiting.  Musculoskeletal: Positive for arthralgias, back pain and myalgias.  Skin: Negative for rash.  Neurological: Positive for headaches. Negative for dizziness and light-headedness.  All other systems reviewed and are negative.    Physical Exam Triage Vital Signs ED Triage Vitals  Enc Vitals Group     BP 10/31/20 1806 133/79     Pulse Rate 10/31/20 1806 76     Resp 10/31/20 1806 18     Temp 10/31/20 1806  98.5 F (36.9 C)     Temp Source 10/31/20 1806 Oral     SpO2 10/31/20 1806 100 %     Weight --      Height --      Head Circumference --      Peak Flow --      Pain Score 10/31/20 1803 7     Pain Loc --      Pain Edu? --      Excl. in GC? --    No data found.  Updated Vital Signs BP 133/79 (BP Location: Right Arm)   Pulse 76   Temp 98.5 F (36.9 C) (Oral)   Resp 18   SpO2 100%   Visual Acuity Right Eye Distance:   Left Eye Distance:   Bilateral Distance:    Right Eye Near:   Left Eye Near:    Bilateral Near:     Physical Exam Vitals and nursing note reviewed.  Constitutional:      General: He is not in acute distress.    Appearance: He is well-developed and well-nourished. He is not ill-appearing,  toxic-appearing or diaphoretic.  HENT:     Head: Normocephalic and atraumatic.     Right Ear: Tympanic membrane and ear canal normal.     Left Ear: Tympanic membrane and ear canal normal.     Nose: Nose normal.     Right Sinus: No maxillary sinus tenderness or frontal sinus tenderness.     Left Sinus: No maxillary sinus tenderness or frontal sinus tenderness.     Mouth/Throat:     Lips: Pink.     Mouth: Mucous membranes are moist.     Pharynx: Oropharynx is clear. Uvula midline. No pharyngeal swelling, oropharyngeal exudate, posterior oropharyngeal erythema or uvula swelling.  Eyes:     Extraocular Movements: EOM normal.  Cardiovascular:     Rate and Rhythm: Normal rate and regular rhythm.  Pulmonary:     Effort: Pulmonary effort is normal. No respiratory distress.     Breath sounds: Normal breath sounds. No stridor. No wheezing, rhonchi or rales.  Musculoskeletal:        General: Normal range of motion.     Cervical back: Normal range of motion and neck supple.  Lymphadenopathy:     Cervical: No cervical adenopathy.  Skin:    General: Skin is warm and dry.  Neurological:     Mental Status: He is alert and oriented to person, place, and time.  Psychiatric:        Mood and Affect: Mood and affect normal.        Behavior: Behavior normal.      UC Treatments / Results  Labs (all labs ordered are listed, but only abnormal results are displayed) Labs Reviewed  SARS CORONAVIRUS 2 (TAT 6-24 HRS)    EKG   Radiology No results found.  Procedures Procedures (including critical care time)  Medications Ordered in UC Medications - No data to display  Initial Impression / Assessment and Plan / UC Course  I have reviewed the triage vital signs and the nursing notes.  Pertinent labs & imaging results that were available during my care of the patient were reviewed by me and considered in my medical decision making (see chart for details).    No evidence of bacterial  infection at this time. COVID pending Encouraged symptomatic tx F/u with PCP as needed AVS given  Final Clinical Impressions(s) / UC Diagnoses   Final diagnoses:  Viral URI  with cough  Close exposure to COVID-19 virus     Discharge Instructions      You may take 500mg  acetaminophen every 4-6 hours or in combination with ibuprofen 400-600mg  every 6-8 hours as needed for pain, inflammation, and fever.  Be sure to well hydrated with clear liquids and get at least 8 hours of sleep at night, preferably more while sick.   Please follow up with family medicine in 1 week if needed.  CDC recommends isolation for 5 days from symptom onset and fever free for 24 hours without use of fever reducing medication and symptoms improving. Then wear a well fitting mask for 5 days when around others. If not improving, isolate for 10 days and consider reevaluation by a medical provider to rule out a secondary bacterial infection.   Call 911 or have someone drive you to the hospital if symptoms significantly worsening.     ED Prescriptions    None     PDMP not reviewed this encounter.   , PA-C 10/31/20 2000

## 2020-10-31 NOTE — Discharge Instructions (Signed)
  You may take 500mg acetaminophen every 4-6 hours or in combination with ibuprofen 400-600mg every 6-8 hours as needed for pain, inflammation, and fever. ° °Be sure to well hydrated with clear liquids and get at least 8 hours of sleep at night, preferably more while sick.  ° °Please follow up with family medicine in 1 week if needed. ° °CDC recommends isolation for 5 days from symptom onset and fever free for 24 hours without use of fever reducing medication and symptoms improving. Then wear a well fitting mask for 5 days when around others. If not improving, isolate for 10 days and consider reevaluation by a medical provider to rule out a secondary bacterial infection.  ° °Call 911 or have someone drive you to the hospital if symptoms significantly worsening. ° ° °

## 2020-11-01 LAB — SARS CORONAVIRUS 2 (TAT 6-24 HRS): SARS Coronavirus 2: NEGATIVE

## 2020-11-27 DIAGNOSIS — L84 Corns and callosities: Secondary | ICD-10-CM | POA: Diagnosis not present

## 2020-11-28 DIAGNOSIS — M79672 Pain in left foot: Secondary | ICD-10-CM | POA: Diagnosis not present

## 2020-11-28 DIAGNOSIS — L84 Corns and callosities: Secondary | ICD-10-CM | POA: Diagnosis not present

## 2020-12-04 ENCOUNTER — Encounter (HOSPITAL_COMMUNITY): Payer: Self-pay | Admitting: Emergency Medicine

## 2020-12-04 ENCOUNTER — Encounter (HOSPITAL_COMMUNITY): Payer: Self-pay

## 2020-12-04 ENCOUNTER — Ambulatory Visit (HOSPITAL_COMMUNITY)
Admission: EM | Admit: 2020-12-04 | Discharge: 2020-12-04 | Disposition: A | Discharge status: Home / Self Care | Payer: Medicare Other

## 2020-12-04 ENCOUNTER — Other Ambulatory Visit: Payer: Self-pay

## 2020-12-04 ENCOUNTER — Emergency Department (HOSPITAL_COMMUNITY)
Admission: EM | Admit: 2020-12-04 | Discharge: 2020-12-04 | Disposition: A | Discharge status: Home / Self Care | Payer: Medicare Other | Attending: Emergency Medicine | Admitting: Emergency Medicine

## 2020-12-04 ENCOUNTER — Emergency Department (HOSPITAL_COMMUNITY): Payer: Medicare Other

## 2020-12-04 DIAGNOSIS — S0990XA Unspecified injury of head, initial encounter: Secondary | ICD-10-CM

## 2020-12-04 DIAGNOSIS — Z0471 Encounter for examination and observation following alleged adult physical abuse: Secondary | ICD-10-CM | POA: Diagnosis not present

## 2020-12-04 DIAGNOSIS — R030 Elevated blood-pressure reading, without diagnosis of hypertension: Secondary | ICD-10-CM

## 2020-12-04 DIAGNOSIS — F17210 Nicotine dependence, cigarettes, uncomplicated: Secondary | ICD-10-CM | POA: Diagnosis not present

## 2020-12-04 DIAGNOSIS — R519 Headache, unspecified: Secondary | ICD-10-CM | POA: Diagnosis not present

## 2020-12-04 DIAGNOSIS — S0512XA Contusion of eyeball and orbital tissues, left eye, initial encounter: Secondary | ICD-10-CM

## 2020-12-04 DIAGNOSIS — S0181XA Laceration without foreign body of other part of head, initial encounter: Secondary | ICD-10-CM | POA: Diagnosis not present

## 2020-12-04 DIAGNOSIS — S060X1A Concussion with loss of consciousness of 30 minutes or less, initial encounter: Secondary | ICD-10-CM | POA: Insufficient documentation

## 2020-12-04 DIAGNOSIS — S01412A Laceration without foreign body of left cheek and temporomandibular area, initial encounter: Secondary | ICD-10-CM | POA: Diagnosis not present

## 2020-12-04 MED ORDER — LIDOCAINE HCL (PF) 1 % IJ SOLN
5.0000 mL | Freq: Once | INTRAMUSCULAR | Status: AC
  Administered 2020-12-04: 5 mL
  Filled 2020-12-04: qty 5

## 2020-12-04 MED ORDER — OXYCODONE-ACETAMINOPHEN 5-325 MG PO TABS: 1.0000 | ORAL_TABLET | Freq: Once | ORAL | Status: DC

## 2020-12-04 MED ORDER — OXYCODONE-ACETAMINOPHEN 5-325 MG PO TABS: 1.0000 | ORAL_TABLET | Freq: Three times a day (TID) | ORAL | 0 refills | Status: DC | PRN

## 2020-12-04 MED ORDER — FENTANYL CITRATE (PF) 100 MCG/2ML IJ SOLN
100.0000 ug | Freq: Once | INTRAMUSCULAR | Status: AC
  Administered 2020-12-04: 100 ug via INTRAMUSCULAR
  Filled 2020-12-04: qty 2

## 2020-12-04 MED ORDER — HYDROCODONE-ACETAMINOPHEN 5-325 MG PO TABS: 1.0000 | ORAL_TABLET | Freq: Four times a day (QID) | ORAL | 0 refills | Status: DC | PRN
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
## 2020-12-04 NOTE — Discharge Instructions (Addendum)
-  Please send straight to [facility], ER for evaluation of your head trauma.  Make sure that your wife is driving a vehicle. -If you lose consciousness, become drowsy, experience weakness or sensation changes in 1 arm or 1 leg, or develop other concerning symptom on the way to the ER to stop and call [phone] immediately.

## 2020-12-04 NOTE — Discharge Instructions (Signed)
You could follow-up in the concussion clinic in around a week.  The stitches can also be taken out in about a week but also are absorbable.

## 2020-12-04 NOTE — ED Provider Notes (Signed)
I was requested to perform laceration repair to L side of face   .Marland KitchenLaceration Repair  Date/Time: 12/04/2020 2:44 PM Performed by: Fayrene Helper, PA-C Authorized by: Fayrene Helper, PA-C   Consent:    Consent obtained:  Verbal   Consent given by:  Patient   Risks discussed:  Infection, need for additional repair, pain, poor cosmetic result and poor wound healing   Alternatives discussed:  No treatment and delayed treatment Universal protocol:    Procedure explained and questions answered to patient or proxy's satisfaction: yes     Relevant documents present and verified: yes     Test results available: yes     Imaging studies available: yes     Required blood products, implants, devices, and special equipment available: yes     Site/side marked: yes     Immediately prior to procedure, a time out was called: yes     Patient identity confirmed:  Verbally with patient Anesthesia:    Anesthesia method:  Local infiltration   Local anesthetic:  Lidocaine 1% w/o epi Laceration details:    Location:  Face   Face location:  L cheek   Length (cm):  2   Depth (mm):  2 Pre-procedure details:    Preparation:  Patient was prepped and draped in usual sterile fashion and imaging obtained to evaluate for foreign bodies Exploration:    Limited defect created (wound extended): no     Hemostasis achieved with:  Direct pressure   Imaging outcome: foreign body not noted     Wound exploration: wound explored through full range of motion and entire depth of wound visualized     Wound extent: no underlying fracture noted     Contaminated: no   Treatment:    Area cleansed with:  Saline and chlorhexidine   Amount of cleaning:  Standard   Irrigation solution:  Sterile saline   Irrigation method:  Pressure wash   Debridement:  None   Undermining:  None   Scar revision: no   Skin repair:    Repair method:  Sutures   Suture size:  6-0   Suture material:  Chromic gut   Suture technique:  Simple  interrupted   Number of sutures:  2 Approximation:    Approximation:  Close Repair type:    Repair type:  Simple Post-procedure details:    Dressing:  Open (no dressing)   Procedure completion:  Tolerated well, no immediate complications      Fayrene Helper, PA-C 12/04/20 1447    Benjiman Core, MD 12/06/20 2053
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
## 2020-12-05 DIAGNOSIS — S0990XA Unspecified injury of head, initial encounter: Secondary | ICD-10-CM | POA: Diagnosis not present
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
## 2021-02-17 DIAGNOSIS — L84 Corns and callosities: Secondary | ICD-10-CM | POA: Diagnosis not present

## 2021-04-25 ENCOUNTER — Emergency Department (HOSPITAL_COMMUNITY)
Admission: EM | Admit: 2021-04-25 | Discharge: 2021-04-25 | Disposition: A | Discharge status: Home / Self Care | Payer: Medicare Other | Attending: Emergency Medicine | Admitting: Emergency Medicine

## 2021-04-25 ENCOUNTER — Emergency Department (HOSPITAL_COMMUNITY): Payer: Medicare Other

## 2021-04-25 ENCOUNTER — Encounter (HOSPITAL_COMMUNITY): Payer: Self-pay | Admitting: Emergency Medicine

## 2021-04-25 ENCOUNTER — Other Ambulatory Visit: Payer: Self-pay

## 2021-04-25 DIAGNOSIS — F17290 Nicotine dependence, other tobacco product, uncomplicated: Secondary | ICD-10-CM | POA: Insufficient documentation

## 2021-04-25 DIAGNOSIS — K611 Rectal abscess: Secondary | ICD-10-CM | POA: Insufficient documentation

## 2021-04-25 DIAGNOSIS — K6289 Other specified diseases of anus and rectum: Secondary | ICD-10-CM | POA: Diagnosis present

## 2021-04-25 DIAGNOSIS — K760 Fatty (change of) liver, not elsewhere classified: Secondary | ICD-10-CM | POA: Diagnosis not present

## 2021-04-25 LAB — CBC WITH DIFFERENTIAL/PLATELET
Abs Immature Granulocytes: 0.01 10*3/uL (ref 0.00–0.07)
Basophils Absolute: 0.1 10*3/uL (ref 0.0–0.1)
Basophils Relative: 1 %
Eosinophils Absolute: 0.1 10*3/uL (ref 0.0–0.5)
Eosinophils Relative: 2 %
HCT: 51.5 % (ref 39.0–52.0)
Hemoglobin: 15.8 g/dL (ref 13.0–17.0)
Immature Granulocytes: 0 %
Lymphocytes Relative: 39 %
Lymphs Abs: 2.4 10*3/uL (ref 0.7–4.0)
MCH: 23.9 pg — ABNORMAL LOW (ref 26.0–34.0)
MCHC: 30.7 g/dL (ref 30.0–36.0)
MCV: 77.8 fL — ABNORMAL LOW (ref 80.0–100.0)
Monocytes Absolute: 0.6 10*3/uL (ref 0.1–1.0)
Monocytes Relative: 10 %
Neutro Abs: 2.9 10*3/uL (ref 1.7–7.7)
Neutrophils Relative %: 48 %
Platelets: 242 10*3/uL (ref 150–400)
RBC: 6.62 MIL/uL — ABNORMAL HIGH (ref 4.22–5.81)
RDW: 15.4 % (ref 11.5–15.5)
WBC: 6.1 10*3/uL (ref 4.0–10.5)
nRBC: 0 % (ref 0.0–0.2)

## 2021-04-25 LAB — URINALYSIS, ROUTINE W REFLEX MICROSCOPIC
Bilirubin Urine: NEGATIVE
Glucose, UA: NEGATIVE mg/dL
Hgb urine dipstick: NEGATIVE
Ketones, ur: 5 mg/dL — AB
Leukocytes,Ua: NEGATIVE
Nitrite: NEGATIVE
Protein, ur: NEGATIVE mg/dL
Specific Gravity, Urine: 1.023 (ref 1.005–1.030)
pH: 6 (ref 5.0–8.0)

## 2021-04-25 LAB — COMPREHENSIVE METABOLIC PANEL
ALT: 12 U/L (ref 0–44)
AST: 19 U/L (ref 15–41)
Albumin: 4.7 g/dL (ref 3.5–5.0)
Alkaline Phosphatase: 61 U/L (ref 38–126)
Anion gap: 9 (ref 5–15)
BUN: 8 mg/dL (ref 6–20)
CO2: 28 mmol/L (ref 22–32)
Calcium: 9.8 mg/dL (ref 8.9–10.3)
Chloride: 102 mmol/L (ref 98–111)
Creatinine, Ser: 1.03 mg/dL (ref 0.61–1.24)
GFR, Estimated: >60 mL/min (ref >60)
Glucose, Bld: 100 mg/dL — ABNORMAL HIGH (ref 70–99)
Potassium: 3.5 mmol/L (ref 3.5–5.1)
Sodium: 139 mmol/L (ref 135–145)
Total Bilirubin: 0.8 mg/dL (ref 0.3–1.2)
Total Protein: 7.8 g/dL (ref 6.5–8.1)

## 2021-04-25 MED ORDER — DOXYCYCLINE HYCLATE 100 MG PO CAPS: 100.0000 mg | ORAL_CAPSULE | Freq: Two times a day (BID) | ORAL | 0 refills | Status: AC

## 2021-04-25 MED ORDER — FENTANYL CITRATE (PF) 100 MCG/2ML IJ SOLN
50.0000 ug | Freq: Once | INTRAMUSCULAR | Status: AC
  Administered 2021-04-25: 50 ug via INTRAVENOUS
  Filled 2021-04-25: qty 2

## 2021-04-25 MED ORDER — IOHEXOL 300 MG/ML  SOLN
100.0000 mL | Freq: Once | INTRAMUSCULAR | Status: AC | PRN
  Administered 2021-04-25: 100 mL via INTRAVENOUS

## 2021-04-25 NOTE — ED Triage Notes (Signed)
Pt reports rectal pain since Monday.  States he felt constipated so he ate cereal with milk (lactose intolerant) twice and was able to have BM twice. Denies known hemorrhoids or rectal bleeding when he wipes.  States he no longer feels constipated.  Unable to sit due to pain.

## 2021-04-25 NOTE — ED Notes (Signed)
Patient transported to CT 

## 2021-04-25 NOTE — ED Provider Notes (Signed)
[facility] EMERGENCY DEPARTMENT Provider Note   CSN: [id number] Arrival date & time: 04/25/21  1008     History Chief Complaint  Patient presents with   Rectal Pain    Craig Graham is a 30 y.o. male.  HPI Patient is a 30 year old male with a medical history as noted below.  He presents to the emergency department due to rectal pain.  He states that his symptoms started about 6 days ago after getting home from vacation.  Initially the pain was dull and has progressively worsened to a severe pain.  He states that he has been taking OTC pain medications as well as stool softeners with little relief.  He is continue to have bowel movements and denies any rectal bleeding or any purulent discharge from the anus.  He states he is sexually active with his wife and denies any other sexual activity.  Denies any history of receptive anal intercourse.  Denies any fevers, chills, chest pain, shortness of breath, abdominal pain, urinary complaints, nausea, vomiting.    History reviewed. No pertinent past medical history.  Patient Active Problem List   Diagnosis Date Noted   Left foot pain 06/25/2020   Foreign body (FB) in soft tissue 06/25/2020    Past Surgical History:  Procedure Laterality Date   FOOT SURGERY Left    left hand surgery         Family History  Problem Relation Age of Onset   COPD Mother    Diabetes Father    Liver cancer Father     Social History   Tobacco Use   Smoking status: Every Day    Packs/day: 1.00    Pack years: 0.00    Types: Cigars, Cigarettes   Smokeless tobacco: Never  Substance Use Topics   Alcohol use: No   Drug use: Not Currently    Types: Marijuana    Home Medications Prior to Admission medications   Medication Sig Start Date End Date Taking? Authorizing Provider  acetaminophen (TYLENOL) 500 MG tablet Take 500 mg by mouth every 6 (six) hours as needed.   Yes [provider]  docusate sodium (COLACE) 100  MG capsule Take 200 mg by mouth daily as needed for mild constipation.   Yes [provider]  doxycycline (VIBRAMYCIN) 100 MG capsule Take 1 capsule (100 mg total) by mouth 2 (two) times daily for 10 days. 04/25/21 05/05/21 Yes Placido Sou, PA-C  ibuprofen (ADVIL) 200 MG tablet Take 200 mg by mouth every 6 (six) hours as needed for moderate pain.   Yes [provider]  naproxen (NAPROSYN) 500 MG tablet Take 1 tablet (500 mg total) by mouth 2 (two) times daily. Patient taking differently: Take 500 mg by mouth 2 (two) times daily as needed for mild pain. 06/24/20  Yes Darr, Gerilyn Pilgrim, PA-C  oxyCODONE-acetaminophen (PERCOCET/ROXICET) 5-325 MG tablet Take 1-2 tablets by mouth every 8 (eight) hours as needed for severe pain. 12/04/20  Yes Benjiman Core, MD  traMADol (ULTRAM) 50 MG tablet Take 100 mg by mouth every 6 (six) hours as needed for severe pain.   Yes [provider]    Allergies    Hydrocodone  Review of Systems   Review of Systems  All other systems reviewed and are negative. Ten systems reviewed and are negative for acute change, except as noted in the HPI.   Physical Exam Updated Vital Signs BP 117/79   Pulse 71   Temp 98.9 F (37.2 C)  Resp 16   Ht 5\' 11"  (1.803 m)   Wt 78 kg   SpO2 100%   BMI 23.99 kg/m   Physical Exam Vitals and nursing note reviewed.  Constitutional:      General: He is not in acute distress.    Appearance: Normal appearance. He is not ill-appearing, toxic-appearing or diaphoretic.  HENT:     Head: Normocephalic and atraumatic.     Right Ear: External ear normal.     Left Ear: External ear normal.     Nose: Nose normal.     Mouth/Throat:     Mouth: Mucous membranes are moist.     Pharynx: Oropharynx is clear. No oropharyngeal exudate or posterior oropharyngeal erythema.  Eyes:     General: No scleral icterus.       Right eye: No discharge.        Left eye: No discharge.     Extraocular Movements: Extraocular  movements intact.     Conjunctiva/sclera: Conjunctivae normal.  Cardiovascular:     Rate and Rhythm: Normal rate and regular rhythm.     Pulses: Normal pulses.     Heart sounds: Normal heart sounds. No murmur heard.   No friction rub. No gallop.  Pulmonary:     Effort: Pulmonary effort is normal. No respiratory distress.     Breath sounds: Normal breath sounds. No stridor. No wheezing, rhonchi or rales.  Abdominal:     General: Abdomen is flat.     Palpations: Abdomen is soft.     Tenderness: There is no abdominal tenderness.  Genitourinary:    Comments: Nursing chaperone present.  Normal-appearing anal region with exquisite pain noted at the 10 o'clock position.  Additional exquisite pain noted with any palpation of the anus.  Unable to perform a DRE due to the severity of the patient's pain.  No obvious erythema, trauma, or bleeding in the region. Musculoskeletal:        General: Normal range of motion.     Cervical back: Normal range of motion and neck supple. No tenderness.  Skin:    General: Skin is warm and dry.  Neurological:     General: No focal deficit present.     Mental Status: He is alert and oriented to person, place, and time.  Psychiatric:        Mood and Affect: Mood normal.        Behavior: Behavior normal.    ED Results / Procedures / Treatments   Labs (all labs ordered are listed, but only abnormal results are displayed) Labs Reviewed  COMPREHENSIVE METABOLIC PANEL - Abnormal; Notable for the following components:      Result Value   Glucose, Bld 100 (*)    All other components within normal limits  CBC WITH DIFFERENTIAL/PLATELET - Abnormal; Notable for the following components:   RBC 6.62 (*)    MCV 77.8 (*)    MCH 23.9 (*)    All other components within normal limits  URINALYSIS, ROUTINE W REFLEX MICROSCOPIC - Abnormal; Notable for the following components:   Ketones, ur 5 (*)    All other components within normal limits     EKG None  Radiology CT ABDOMEN PELVIS W CONTRAST  Result Date: 04/25/2021 CLINICAL DATA:  Perirectal pain and swelling.  Recent constipation. EXAM: CT ABDOMEN AND PELVIS WITH CONTRAST TECHNIQUE: Multidetector CT imaging of the abdomen and pelvis was performed using the standard protocol following bolus administration of intravenous contrast. CONTRAST:  06/26/2021 OMNIPAQUE IOHEXOL 300  MG/ML  SOLN COMPARISON:  12/28/2016 FINDINGS: Lower chest: No acute abnormality. Hepatobiliary: Mild diffuse low density of the liver. Normal appearing gallbladder. Pancreas: Unremarkable. No pancreatic ductal dilatation or surrounding inflammatory changes. Spleen: Normal in size without focal abnormality. Adrenals/Urinary Tract: Multiple small bilateral renal calculi measuring up to 5 mm in maximum diameter each. No bladder or ureteral calculi and no hydronephrosis. Normal appearing adrenal glands. Stomach/Bowel: Stomach is within normal limits. Appendix appears normal. No evidence of bowel wall thickening, distention, or inflammatory changes. Vascular/Lymphatic: No significant vascular findings are present. No enlarged abdominal or pelvic lymph nodes. Reproductive: Prostate is unremarkable. Other: Very small posterior perirectal/perianal fluid collection with mild surrounding rim enhancement. This measures 1.0 x 0.9 cm on image number 81/3 and 0.9 cm in length on image number 95/6. Musculoskeletal: Normal appearing bones. IMPRESSION: 1. Very small posterior perirectal/perianal abscess measuring 1.0 x 0.9 x 0.9 cm. 2. Multiple small, nonobstructing bilateral renal calculi. 3. Mild diffuse hepatic steatosis. Electronically Signed   By: Beckie Salts M.D.   On: 04/25/2021 14:16    Procedures Procedures   Medications Ordered in ED Medications  fentaNYL (SUBLIMAZE) injection 50 mcg (50 mcg Intravenous Given 04/25/21 1227)  fentaNYL (SUBLIMAZE) injection 50 mcg (50 mcg Intravenous Given 04/25/21 1333)  iohexol (OMNIPAQUE) 300  MG/ML solution 100 mL (100 mLs Intravenous Contrast Given 04/25/21 1405)    ED Course  I have reviewed the triage vital signs and the nursing notes.  Pertinent labs & imaging results that were available during my care of the patient were reviewed by me and considered in my medical decision making (see chart for details).    MDM Rules/Calculators/A&P                          Pt is a 30 y.o. male who presents to the emergency department due to rectal pain.  Labs: CBC with an RBC of 6.62, MCV of 77.8, MCH of 23.9. CMP with glucose of 100. UA with 5 ketones.  Imaging: CT scan of the abdomen/pelvis with contrast shows a very small posterior perirectal/perianal abscess measuring 1 x 0.9 x 0.9 cm.  I, Placido Sou, PA-C, personally reviewed and evaluated these images and lab results as part of my medical decision-making.  Patient discussed with Dr. Janee Morn with general surgery.  Agrees that this will likely resolve with antibiotics.  Recommends we give patient their contact information to follow-up on Monday if his symptoms persist.  Will discharge patient on the course of doxycycline.  Recommended continued use of Tylenol/ibuprofen for management of his pain.  Feel that he is stable for discharge at this time and he is agreeable.  His questions were answered and he was amicable at the time of discharge.  Note: Portions of this report may have been transcribed using voice recognition software. Every effort was made to ensure accuracy; however, inadvertent computerized transcription errors may be present.   Final Clinical Impression(s) / ED Diagnoses Final diagnoses:  Perirectal abscess    Rx / DC Orders ED Discharge Orders          Ordered    doxycycline (VIBRAMYCIN) 100 MG capsule  2 times daily        04/25/21 1500             Placido Sou, PA-C 04/25/21 1504    Vanetta Mulders, MD 04/26/21 [phone]

## 2021-04-25 NOTE — Discharge Instructions (Addendum)
I prescribed you an antibiotic called doxycycline.  Please take this twice a day for the next 10 days.  Do not stop taking this medication early.  Below is the contact information for [facility].  Please give them a call on Monday if you find that your symptoms have not began improving.  If your symptoms worsen please come back to the emergency department.  It was a pleasure to meet you.

## 2021-04-26 ENCOUNTER — Encounter (HOSPITAL_COMMUNITY): Payer: Self-pay | Admitting: Emergency Medicine

## 2021-04-26 ENCOUNTER — Other Ambulatory Visit: Payer: Self-pay

## 2021-04-26 ENCOUNTER — Emergency Department (HOSPITAL_COMMUNITY)
Admission: EM | Admit: 2021-04-26 | Discharge: 2021-04-26 | Disposition: A | Discharge status: Home / Self Care | Payer: Medicare Other | Attending: Emergency Medicine | Admitting: Emergency Medicine

## 2021-04-26 DIAGNOSIS — K611 Rectal abscess: Secondary | ICD-10-CM | POA: Insufficient documentation

## 2021-04-26 DIAGNOSIS — F17210 Nicotine dependence, cigarettes, uncomplicated: Secondary | ICD-10-CM | POA: Insufficient documentation

## 2021-04-26 DIAGNOSIS — R52 Pain, unspecified: Secondary | ICD-10-CM

## 2021-04-26 DIAGNOSIS — K6289 Other specified diseases of anus and rectum: Secondary | ICD-10-CM | POA: Diagnosis not present

## 2021-04-26 MED ORDER — OXYCODONE-ACETAMINOPHEN 5-325 MG PO TABS
1.0000 | ORAL_TABLET | ORAL | Status: DC | PRN
  Administered 2021-04-26: 1 via ORAL
  Filled 2021-04-26: qty 1

## 2021-04-26 MED ORDER — LIDOCAINE 5 % EX OINT: 1.0000 "application " | TOPICAL_OINTMENT | CUTANEOUS | 0 refills | Status: DC | PRN

## 2021-04-26 MED ORDER — OXYCODONE-ACETAMINOPHEN 5-325 MG PO TABS: 1.0000 | ORAL_TABLET | Freq: Four times a day (QID) | ORAL | 0 refills | Status: AC | PRN

## 2021-04-26 NOTE — Discharge Instructions (Addendum)
As discussed, it is importantly follow-up with our surgery colleagues via telephone tomorrow for appropriate ongoing management of your abscess.  Please obtain and use your medication as directed.  Do not hesitate to return here for concerning changes in your condition.

## 2021-04-26 NOTE — ED Triage Notes (Signed)
Pt seen in ED yesterday for perirectal abscess and instructed to follow-up with surgeon.  States he only has Tylenol and Ibuprofen to take and pain is severe.

## 2021-04-26 NOTE — ED Notes (Signed)
Pt d/c home per MD order. Discharge summary reviewed, pt verbalizes understanding. Ambulatory off unit. No s/s of acute distress noted.  

## 2021-04-26 NOTE — ED Provider Notes (Signed)
[facility] EMERGENCY DEPARTMENT Provider Note   CSN: [id number] Arrival date & time: 04/26/21  2458     History Chief Complaint  Patient presents with   perirectal abscess    Craig Graham is a 30 y.o. male.  HPI Patient presents with pain in his perirectal region, worse with bowel movements.  Pain is not substantially changed with ibuprofen, Tylenol.  He was seen and evaluated yesterday, diagnosed with perirectal abscess via CT scan.  History is obtained by the patient and ED provider notes.  Patient's case was discussed with our surgery colleagues, who recommended outpatient follow-up, discharged with a analgesia. Patient notes no interval fever, chills, vomiting, other complaints including abdominal pain.  However, with worsening pain as above not improved with ibuprofen, Tylenol he presents for evaluation.     History reviewed. No pertinent past medical history.  Patient Active Problem List   Diagnosis Date Noted   Left foot pain 06/25/2020   Foreign body (FB) in soft tissue 06/25/2020    Past Surgical History:  Procedure Laterality Date   FOOT SURGERY Left    left hand surgery         Family History  Problem Relation Age of Onset   COPD Mother    Diabetes Father    Liver cancer Father     Social History   Tobacco Use   Smoking status: Every Day    Packs/day: 1.00    Pack years: 0.00    Types: Cigars, Cigarettes   Smokeless tobacco: Never  Substance Use Topics   Alcohol use: No   Drug use: Not Currently    Types: Marijuana    Home Medications Prior to Admission medications   Medication Sig Start Date End Date Taking? Authorizing Provider  lidocaine (XYLOCAINE) 5 % ointment Apply 1 application topically as needed. 04/26/21  Yes Gerhard Munch, MD  acetaminophen (TYLENOL) 500 MG tablet Take 500 mg by mouth every 6 (six) hours as needed.    [provider]  docusate sodium (COLACE) 100 MG capsule Take 200 mg by mouth  daily as needed for mild constipation.    [provider]  doxycycline (VIBRAMYCIN) 100 MG capsule Take 1 capsule (100 mg total) by mouth 2 (two) times daily for 10 days. 04/25/21 05/05/21  Placido Sou, PA-C  ibuprofen (ADVIL) 200 MG tablet Take 200 mg by mouth every 6 (six) hours as needed for moderate pain.    [provider]  naproxen (NAPROSYN) 500 MG tablet Take 1 tablet (500 mg total) by mouth 2 (two) times daily. Patient taking differently: Take 500 mg by mouth 2 (two) times daily as needed for mild pain. 06/24/20   Darr, Gerilyn Pilgrim, PA-C  oxyCODONE-acetaminophen (PERCOCET/ROXICET) 5-325 MG tablet Take 1 tablet by mouth every 6 (six) hours as needed for severe pain. 04/26/21   Gerhard Munch, MD  traMADol (ULTRAM) 50 MG tablet Take 100 mg by mouth every 6 (six) hours as needed for severe pain.    [provider]    Allergies    Hydrocodone  Review of Systems   Review of Systems  Constitutional:        Per HPI, otherwise negative  HENT:         Per HPI, otherwise negative  Respiratory:         Per HPI, otherwise negative  Cardiovascular:        Per HPI, otherwise negative  Gastrointestinal:  Negative for vomiting.  Endocrine:  Negative aside from HPI  Genitourinary:        Neg aside from HPI   Musculoskeletal:        Per HPI, otherwise negative  Skin: Negative.   Neurological:  Negative for syncope.   Physical Exam Updated Vital Signs BP (!) 177/124 (BP Location: Left Arm)   Pulse (!) 116   Temp 98.3 F (36.8 C) (Oral)   Resp 18   SpO2 99%   Physical Exam Vitals and nursing note reviewed.  Constitutional:      General: He is not in acute distress.    Appearance: He is well-developed.  HENT:     Head: Normocephalic and atraumatic.  Eyes:     Conjunctiva/sclera: Conjunctivae normal.  Pulmonary:     Effort: Pulmonary effort is normal. No respiratory distress.     Breath sounds: No stridor.  Abdominal:     General: There is no  distension.     Tenderness: There is no abdominal tenderness. There is no guarding.  Genitourinary:    Comments: Patient declines repeat evaluation of his perirectal abscess, notes no changes observed at home. Skin:    General: Skin is warm and dry.  Neurological:     Mental Status: He is alert and oriented to person, place, and time.    ED Results / Procedures / Treatments   Labs (all labs ordered are listed, but only abnormal results are displayed) Labs Reviewed - No data to display  EKG None  Radiology CT ABDOMEN PELVIS W CONTRAST  Result Date: 04/25/2021 CLINICAL DATA:  Perirectal pain and swelling.  Recent constipation. EXAM: CT ABDOMEN AND PELVIS WITH CONTRAST TECHNIQUE: Multidetector CT imaging of the abdomen and pelvis was performed using the standard protocol following bolus administration of intravenous contrast. CONTRAST:  OMNIPAQUE IOHEXOL 300 MG/ML  SOLN COMPARISON:  12/28/2016 FINDINGS: Lower chest: No acute abnormality. Hepatobiliary: Mild diffuse low density of the liver. Normal appearing gallbladder. Pancreas: Unremarkable. No pancreatic ductal dilatation or surrounding inflammatory changes. Spleen: Normal in size without focal abnormality. Adrenals/Urinary Tract: Multiple small bilateral renal calculi measuring up to 5 mm in maximum diameter each. No bladder or ureteral calculi and no hydronephrosis. Normal appearing adrenal glands. Stomach/Bowel: Stomach is within normal limits. Appendix appears normal. No evidence of bowel wall thickening, distention, or inflammatory changes. Vascular/Lymphatic: No significant vascular findings are present. No enlarged abdominal or pelvic lymph nodes. Reproductive: Prostate is unremarkable. Other: Very small posterior perirectal/perianal fluid collection with mild surrounding rim enhancement. This measures 1.0 x 0.9 cm on image number 81/3 and 0.9 cm in length on image number 95/6. Musculoskeletal: Normal appearing bones. IMPRESSION: 1.  Very small posterior perirectal/perianal abscess measuring 1.0 x 0.9 x 0.9 cm. 2. Multiple small, nonobstructing bilateral renal calculi. 3. Mild diffuse hepatic steatosis. Electronically Signed   By: Beckie Salts M.D.   On: 04/25/2021 14:16    Procedures Procedures   Medications Ordered in ED Medications  oxyCODONE-acetaminophen (PERCOCET/ROXICET) 5-325 MG per tablet 1 tablet (1 tablet Oral Given 04/26/21 1003)    ED Course  I have reviewed the triage vital signs and the nursing notes.  Pertinent labs & imaging results that were available during my care of the patient were reviewed by me and considered in my medical decision making (see chart for details).  Patient's pain is improved with Percocet provided in triage.  Adult male presents 1 day after diagnosis with perirectal abscess now with issues with pain control.  No description of new complaints, no physical exam  findings that are concerning, patient improved here, was discharged in stable condition with new analgesic regimen, surgery care follow-up. Final Clinical Impression(s) / ED Diagnoses Final diagnoses:  Pain    Rx / DC Orders ED Discharge Orders          Ordered    lidocaine (XYLOCAINE) 5 % ointment  As needed        04/26/21 1134    oxyCODONE-acetaminophen (PERCOCET/ROXICET) 5-325 MG tablet  Every 6 hours PRN        04/26/21 1134             Gerhard Munch, MD 04/26/21 1136

## 2021-04-28 ENCOUNTER — Other Ambulatory Visit: Payer: Self-pay | Admitting: Student

## 2021-04-28 ENCOUNTER — Other Ambulatory Visit (HOSPITAL_COMMUNITY): Payer: Self-pay | Admitting: Student

## 2021-04-28 DIAGNOSIS — K6289 Other specified diseases of anus and rectum: Secondary | ICD-10-CM | POA: Diagnosis not present

## 2021-04-28 DIAGNOSIS — K610 Anal abscess: Secondary | ICD-10-CM | POA: Diagnosis not present

## 2021-04-29 ENCOUNTER — Ambulatory Visit (HOSPITAL_COMMUNITY)
Admission: RE | Admit: 2021-04-29 | Discharge: 2021-04-29 | Disposition: A | Discharge status: Home / Self Care | Payer: Medicare Other | Source: Ambulatory Visit | Attending: Student | Admitting: Student

## 2021-04-29 ENCOUNTER — Other Ambulatory Visit: Payer: Self-pay

## 2021-04-29 DIAGNOSIS — K610 Anal abscess: Secondary | ICD-10-CM | POA: Diagnosis not present

## 2021-04-29 DIAGNOSIS — K6289 Other specified diseases of anus and rectum: Secondary | ICD-10-CM | POA: Diagnosis not present

## 2021-04-29 DIAGNOSIS — R188 Other ascites: Secondary | ICD-10-CM | POA: Diagnosis not present

## 2021-04-29 DIAGNOSIS — K611 Rectal abscess: Secondary | ICD-10-CM | POA: Diagnosis not present

## 2021-04-29 MED ORDER — IOHEXOL 350 MG/ML SOLN
80.0000 mL | Freq: Once | INTRAVENOUS | Status: AC | PRN
  Administered 2021-04-29: 80 mL via INTRAVENOUS

## 2021-04-29 MED ORDER — SODIUM CHLORIDE (PF) 0.9 % IJ SOLN
INTRAMUSCULAR | Status: AC
  Filled 2021-04-29: qty 50

## 2021-05-06 DIAGNOSIS — K611 Rectal abscess: Secondary | ICD-10-CM | POA: Diagnosis not present

## 2021-05-06 DIAGNOSIS — R030 Elevated blood-pressure reading, without diagnosis of hypertension: Secondary | ICD-10-CM | POA: Diagnosis not present

## 2021-05-27 DIAGNOSIS — K611 Rectal abscess: Secondary | ICD-10-CM | POA: Diagnosis not present

## 2021-06-02 DIAGNOSIS — K611 Rectal abscess: Secondary | ICD-10-CM | POA: Insufficient documentation

## 2021-06-09 DIAGNOSIS — F17290 Nicotine dependence, other tobacco product, uncomplicated: Secondary | ICD-10-CM | POA: Diagnosis not present

## 2021-06-09 DIAGNOSIS — K603 Anal fistula: Secondary | ICD-10-CM | POA: Diagnosis not present

## 2021-06-09 DIAGNOSIS — Z885 Allergy status to narcotic agent status: Secondary | ICD-10-CM | POA: Diagnosis not present

## 2021-06-09 DIAGNOSIS — F17200 Nicotine dependence, unspecified, uncomplicated: Secondary | ICD-10-CM | POA: Diagnosis not present

## 2021-06-09 DIAGNOSIS — K611 Rectal abscess: Secondary | ICD-10-CM | POA: Diagnosis not present

## 2021-06-11 DIAGNOSIS — K611 Rectal abscess: Secondary | ICD-10-CM | POA: Diagnosis not present

## 2021-06-24 DIAGNOSIS — K603 Anal fistula: Secondary | ICD-10-CM | POA: Diagnosis not present

## 2021-07-01 DIAGNOSIS — K603 Anal fistula: Secondary | ICD-10-CM | POA: Diagnosis not present

## 2021-07-19 ENCOUNTER — Other Ambulatory Visit: Payer: Self-pay

## 2021-07-19 ENCOUNTER — Encounter (HOSPITAL_COMMUNITY): Payer: Self-pay | Admitting: Emergency Medicine

## 2021-07-19 ENCOUNTER — Emergency Department (HOSPITAL_COMMUNITY)
Admission: EM | Admit: 2021-07-19 | Discharge: 2021-07-20 | Disposition: A | Discharge status: Home / Self Care | Payer: No Typology Code available for payment source | Attending: Emergency Medicine | Admitting: Emergency Medicine

## 2021-07-19 DIAGNOSIS — S7002XA Contusion of left hip, initial encounter: Secondary | ICD-10-CM

## 2021-07-19 DIAGNOSIS — F17210 Nicotine dependence, cigarettes, uncomplicated: Secondary | ICD-10-CM | POA: Diagnosis not present

## 2021-07-19 DIAGNOSIS — R61 Generalized hyperhidrosis: Secondary | ICD-10-CM | POA: Diagnosis not present

## 2021-07-19 DIAGNOSIS — R519 Headache, unspecified: Secondary | ICD-10-CM | POA: Insufficient documentation

## 2021-07-19 DIAGNOSIS — M542 Cervicalgia: Secondary | ICD-10-CM | POA: Diagnosis not present

## 2021-07-19 DIAGNOSIS — S199XXA Unspecified injury of neck, initial encounter: Secondary | ICD-10-CM | POA: Diagnosis present

## 2021-07-19 DIAGNOSIS — M545 Low back pain, unspecified: Secondary | ICD-10-CM | POA: Diagnosis not present

## 2021-07-19 DIAGNOSIS — S161XXA Strain of muscle, fascia and tendon at neck level, initial encounter: Secondary | ICD-10-CM | POA: Insufficient documentation

## 2021-07-19 DIAGNOSIS — R42 Dizziness and giddiness: Secondary | ICD-10-CM | POA: Diagnosis not present

## 2021-07-19 DIAGNOSIS — R9431 Abnormal electrocardiogram [ECG] [EKG]: Secondary | ICD-10-CM | POA: Diagnosis not present

## 2021-07-19 DIAGNOSIS — Y92410 Unspecified street and highway as the place of occurrence of the external cause: Secondary | ICD-10-CM | POA: Insufficient documentation

## 2021-07-19 DIAGNOSIS — Z041 Encounter for examination and observation following transport accident: Secondary | ICD-10-CM | POA: Diagnosis not present
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
## 2021-07-20 ENCOUNTER — Emergency Department (HOSPITAL_COMMUNITY): Payer: No Typology Code available for payment source

## 2021-07-20 DIAGNOSIS — Z041 Encounter for examination and observation following transport accident: Secondary | ICD-10-CM | POA: Diagnosis not present

## 2021-07-20 DIAGNOSIS — M542 Cervicalgia: Secondary | ICD-10-CM | POA: Diagnosis not present

## 2021-07-20 MED ORDER — FENTANYL CITRATE PF 50 MCG/ML IJ SOSY
50.0000 ug | PREFILLED_SYRINGE | Freq: Once | INTRAMUSCULAR | Status: AC
  Administered 2021-07-20: 50 ug via INTRAVENOUS
  Filled 2021-07-20: qty 1
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
## 2021-08-05 DIAGNOSIS — K603 Anal fistula: Secondary | ICD-10-CM | POA: Diagnosis not present

## 2021-08-11 ENCOUNTER — Other Ambulatory Visit: Payer: Self-pay

## 2021-08-11 ENCOUNTER — Emergency Department (HOSPITAL_BASED_OUTPATIENT_CLINIC_OR_DEPARTMENT_OTHER)
Admission: EM | Admit: 2021-08-11 | Discharge: 2021-08-11 | Disposition: A | Discharge status: Home / Self Care | Payer: Medicare Other | Attending: Emergency Medicine | Admitting: Emergency Medicine

## 2021-08-11 ENCOUNTER — Emergency Department (HOSPITAL_BASED_OUTPATIENT_CLINIC_OR_DEPARTMENT_OTHER): Payer: Medicare Other

## 2021-08-11 ENCOUNTER — Encounter (HOSPITAL_BASED_OUTPATIENT_CLINIC_OR_DEPARTMENT_OTHER): Payer: Self-pay | Admitting: Emergency Medicine

## 2021-08-11 DIAGNOSIS — R61 Generalized hyperhidrosis: Secondary | ICD-10-CM | POA: Diagnosis not present

## 2021-08-11 DIAGNOSIS — M545 Low back pain, unspecified: Secondary | ICD-10-CM | POA: Insufficient documentation

## 2021-08-11 DIAGNOSIS — F17210 Nicotine dependence, cigarettes, uncomplicated: Secondary | ICD-10-CM | POA: Insufficient documentation

## 2021-08-11 DIAGNOSIS — R000 Tachycardia, unspecified: Secondary | ICD-10-CM | POA: Insufficient documentation

## 2021-08-11 DIAGNOSIS — M5459 Other low back pain: Secondary | ICD-10-CM | POA: Diagnosis not present

## 2021-08-11 LAB — CBC WITH DIFFERENTIAL/PLATELET
Abs Immature Granulocytes: 0.02 10*3/uL (ref 0.00–0.07)
Basophils Absolute: 0.1 10*3/uL (ref 0.0–0.1)
Basophils Relative: 1 %
Eosinophils Absolute: 0.1 10*3/uL (ref 0.0–0.5)
Eosinophils Relative: 2 %
HCT: 48.5 % (ref 39.0–52.0)
Hemoglobin: 15.2 g/dL (ref 13.0–17.0)
Immature Granulocytes: 0 %
Lymphocytes Relative: 44 %
Lymphs Abs: 2.4 10*3/uL (ref 0.7–4.0)
MCH: 23.6 pg — ABNORMAL LOW (ref 26.0–34.0)
MCHC: 31.3 g/dL (ref 30.0–36.0)
MCV: 75.4 fL — ABNORMAL LOW (ref 80.0–100.0)
Monocytes Absolute: 0.9 10*3/uL (ref 0.1–1.0)
Monocytes Relative: 16 %
Neutro Abs: 2 10*3/uL (ref 1.7–7.7)
Neutrophils Relative %: 37 %
Platelets: 233 10*3/uL (ref 150–400)
RBC: 6.43 MIL/uL — ABNORMAL HIGH (ref 4.22–5.81)
RDW: 15.7 % — ABNORMAL HIGH (ref 11.5–15.5)
WBC: 5.5 10*3/uL (ref 4.0–10.5)
nRBC: 0 % (ref 0.0–0.2)

## 2021-08-11 LAB — COMPREHENSIVE METABOLIC PANEL
ALT: 46 U/L — ABNORMAL HIGH (ref 0–44)
AST: 35 U/L (ref 15–41)
Albumin: 4.5 g/dL (ref 3.5–5.0)
Alkaline Phosphatase: 68 U/L (ref 38–126)
Anion gap: 11 (ref 5–15)
BUN: 8 mg/dL (ref 6–20)
CO2: 25 mmol/L (ref 22–32)
Calcium: 10 mg/dL (ref 8.9–10.3)
Chloride: 97 mmol/L — ABNORMAL LOW (ref 98–111)
Creatinine, Ser: 0.74 mg/dL (ref 0.61–1.24)
GFR, Estimated: >60 mL/min (ref >60)
Glucose, Bld: 87 mg/dL (ref 70–99)
Potassium: 4.3 mmol/L (ref 3.5–5.1)
Sodium: 133 mmol/L — ABNORMAL LOW (ref 135–145)
Total Bilirubin: 0.4 mg/dL (ref 0.3–1.2)
Total Protein: 7.6 g/dL (ref 6.5–8.1)

## 2021-08-11 LAB — C-REACTIVE PROTEIN: CRP: 0.5 mg/dL (ref <1.0)

## 2021-08-11 LAB — SEDIMENTATION RATE: Sed Rate: 1 mm/hr (ref 0–16)

## 2021-08-11 MED ORDER — LACTATED RINGERS IV BOLUS
1000.0000 mL | Freq: Once | INTRAVENOUS | Status: AC
  Administered 2021-08-11: 1000 mL via INTRAVENOUS

## 2021-08-11 MED ORDER — KETOROLAC TROMETHAMINE 15 MG/ML IJ SOLN
15.0000 mg | Freq: Once | INTRAMUSCULAR | Status: AC
  Administered 2021-08-11: 15 mg via INTRAVENOUS
  Filled 2021-08-11: qty 1

## 2021-08-11 MED ORDER — OXYCODONE-ACETAMINOPHEN 5-325 MG PO TABS
1.0000 | ORAL_TABLET | Freq: Once | ORAL | Status: AC
Start: 2021-08-11 — End: 2021-08-11
  Administered 2021-08-11: 1 via ORAL
  Filled 2021-08-11: qty 1

## 2021-08-11 MED ORDER — MELOXICAM 7.5 MG PO TABS: 7.5000 mg | ORAL_TABLET | Freq: Every day | ORAL | 0 refills | Status: AC

## 2021-08-11 NOTE — ED Triage Notes (Signed)
R lower back pain since Sunday. He bent over to put something in the oven and when he came up he felt the pain.
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
## 2021-08-18 ENCOUNTER — Encounter: Payer: Self-pay | Admitting: Orthopaedic Surgery

## 2021-08-18 ENCOUNTER — Other Ambulatory Visit: Payer: Self-pay

## 2021-08-18 ENCOUNTER — Ambulatory Visit (INDEPENDENT_AMBULATORY_CARE_PROVIDER_SITE_OTHER): Payer: Medicare Other | Admitting: Orthopaedic Surgery

## 2021-08-18 DIAGNOSIS — M545 Low back pain, unspecified: Secondary | ICD-10-CM | POA: Diagnosis not present
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
## 2021-09-08 ENCOUNTER — Ambulatory Visit: Payer: Medicare Other | Admitting: Physical Medicine and Rehabilitation

## 2021-09-09 DIAGNOSIS — K603 Anal fistula: Secondary | ICD-10-CM | POA: Diagnosis not present

## 2021-09-22 ENCOUNTER — Ambulatory Visit: Payer: Medicare Other

## 2021-09-29 ENCOUNTER — Ambulatory Visit: Payer: Medicare Other | Admitting: Physical Medicine and Rehabilitation

## 2021-09-30 ENCOUNTER — Ambulatory Visit: Payer: No Typology Code available for payment source
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
## 2021-10-13 ENCOUNTER — Ambulatory Visit: Payer: No Typology Code available for payment source | Admitting: Physical Therapy

## 2021-10-27 ENCOUNTER — Ambulatory Visit: Payer: Medicare Other | Admitting: Physical Medicine and Rehabilitation

## 2021-10-27 ENCOUNTER — Other Ambulatory Visit: Payer: Self-pay

## 2021-11-11 DIAGNOSIS — K603 Anal fistula: Secondary | ICD-10-CM | POA: Diagnosis not present

## 2021-12-16 ENCOUNTER — Telehealth: Payer: Self-pay | Admitting: Physical Medicine and Rehabilitation

## 2021-12-16 NOTE — Telephone Encounter (Signed)
Patient called asked if he can schedule an appointment with Dr Ernestina Patches for an injection in his back. The number to contact patient is [phone]   ?

## 2021-12-17 ENCOUNTER — Telehealth: Payer: Self-pay

## 2021-12-17 DIAGNOSIS — F411 Generalized anxiety disorder: Secondary | ICD-10-CM

## 2021-12-17 NOTE — Telephone Encounter (Signed)
Pt request Rx for inj appt on 01/05/22, Pharmacy [facility] on [address]. Please advise ?

## 2022-01-04 MED ORDER — DIAZEPAM 5 MG PO TABS: ORAL_TABLET | ORAL | 0 refills | Status: DC

## 2022-01-04 NOTE — Telephone Encounter (Signed)
Pre-procedure diazepam ordered for pre-operative anxiety.  

## 2022-01-04 NOTE — Addendum Note (Signed)
Addended by: Ashok Norris on: 01/04/2022 11:02 AM ? ? Modules accepted: Orders ? ?

## 2022-01-05 ENCOUNTER — Other Ambulatory Visit: Payer: Self-pay

## 2022-01-05 ENCOUNTER — Ambulatory Visit: Payer: Medicare Other

## 2022-01-05 ENCOUNTER — Ambulatory Visit (INDEPENDENT_AMBULATORY_CARE_PROVIDER_SITE_OTHER): Payer: Medicare Other | Admitting: Physical Medicine and Rehabilitation

## 2022-01-05 ENCOUNTER — Encounter: Payer: Self-pay | Admitting: Physical Medicine and Rehabilitation

## 2022-01-05 VITALS — BP 127/89 | HR 120

## 2022-01-05 DIAGNOSIS — M5416 Radiculopathy, lumbar region: Secondary | ICD-10-CM

## 2022-01-05 MED ORDER — METHYLPREDNISOLONE ACETATE 80 MG/ML IJ SUSP
80.0000 mg | Freq: Once | INTRAMUSCULAR | Status: AC
  Administered 2022-01-05: 80 mg

## 2022-01-05 NOTE — Progress Notes (Signed)
Pain is 5/10 due to taking pain medication and valium. ?

## 2022-01-15 NOTE — Progress Notes (Signed)
? ?Craig Graham - 31 y.o. male MRN [id number]  Date of birth: January 14, 1991 ? ?Office Visit Note: ?Visit Date: 01/05/2022 ?PCP: Rometta Emery, MD ?Referred by: Rometta Emery, MD ? ?Subjective: ?Chief Complaint  ?Patient presents with  ? Lower Back - Follow-up  ? ?HPI:  Craig Graham is a 31 y.o. male who comes in today at the request of Dr. Norlene Campbell for planned Left L5-S1 Lumbar Interlaminar epidural steroid injection with fluoroscopic guidance.  The patient has failed conservative care including home exercise, medications, time and activity modification.  This injection will be diagnostic and hopefully therapeutic.  Please see requesting physician notes for further details and justification. ? ?ROS Otherwise per HPI. ? ?Assessment & Plan: ?Visit Diagnoses:  ?  ICD-10-CM   ?1. Lumbar radiculopathy  M54.16 XR C-ARM NO REPORT  ?  Epidural Steroid injection  ?  methylPREDNISolone acetate (DEPO-MEDROL) injection 80 mg  ?  ?  ?Plan: No additional findings.  ? ?Meds & Orders:  ?Meds ordered this encounter  ?Medications  ? methylPREDNISolone acetate (DEPO-MEDROL) injection 80 mg  ?  ?Orders Placed This Encounter  ?Procedures  ? XR C-ARM NO REPORT  ? Epidural Steroid injection  ?  ?Follow-up: Return if symptoms worsen or fail to improve.  ? ?Procedures: ?No procedures performed  ?Lumbar Epidural Steroid Injection - Interlaminar Approach with Fluoroscopic Guidance ? ?Patient: Craig Graham      ?Date of Birth: 1991/03/02 ?MRN: [id number] ?PCP: Rometta Emery, MD      ?Visit Date: 01/05/2022 ?  ?Universal Protocol:    ? ?Consent Given By: the patient ? ?Position: PRONE ? ?Additional Comments: ?Vital signs were monitored before and after the procedure. ?Patient was prepped and draped in the usual sterile fashion. ?The correct patient, procedure, and site was verified. ? ? ?Injection Procedure Details:  ? ?Procedure diagnoses: Lumbar radiculopathy [M54.16]  ? ?Meds Administered:  ?Meds ordered this  encounter  ?Medications  ? methylPREDNISolone acetate (DEPO-MEDROL) injection 80 mg  ?  ? ?Laterality: Left ? ?Location/Site:  L5-S1 ? ?Needle: 3.5 in., 20 ga. Tuohy ? ?Needle Placement: Paramedian epidural ? ?Findings:  ? -Comments: Excellent flow of contrast into the epidural space. ? ?Procedure Details: ?Using a paramedian approach from the side mentioned above, the region overlying the inferior lamina was localized under fluoroscopic visualization and the soft tissues overlying this structure were infiltrated with 4 ml. of 1% Lidocaine without Epinephrine. The Tuohy needle was inserted into the epidural space using a paramedian approach.  ? ?The epidural space was localized using loss of resistance along with counter oblique bi-planar fluoroscopic views.  After negative aspirate for air, blood, and CSF, a 2 ml. volume of Isovue-250 was injected into the epidural space and the flow of contrast was observed. Radiographs were obtained for documentation purposes.   ? ?The injectate was administered into the level noted above. ? ? ?Additional Comments:  ?The patient tolerated the procedure well ?Dressing: 2 x 2 sterile gauze and Band-Aid ?  ? ?Post-procedure details: ?Patient was observed during the procedure. ?Post-procedure instructions were reviewed. ? ?Patient left the clinic in stable condition.   ? ?Clinical History: ?CT LUMBAR SPINE WITHOUT CONTRAST ?  ?TECHNIQUE: ?Multidetector CT imaging of the lumbar spine was performed without ?intravenous contrast administration. Multiplanar CT image ?reconstructions were also generated. ?  ?COMPARISON:  Lumbar spine radiograph 07/20/2021. ?  ?FINDINGS: ?Segmentation: 5 lumbar type vertebrae. ?  ?Alignment: Trace retrolisthesis at L5-S1. ?  ?Vertebrae: No acute lumbar spine  fracture. No suspicious lytic or ?blastic lesion. Pseudoarticulation of the left L5 transverse process ?with the sacrum. ?  ?Paraspinal and other soft tissues: Nonobstructive 3 mm right renal ?stone. ?   ?Disc levels: There is broad-based disc bulging at L4-L5 and ?left-sided facet spurring which results in moderate spinal canal ?stenosis and moderate bilateral neural foraminal narrowing. At ?L5-S1, there is a right paracentral disc herniation resulting in ?lateral recess narrowing and potentially impinging the descending ?right S1 nerve root. Likely mild right neural foraminal narrowing. ?  ?IMPRESSION: ?Moderate spinal canal stenosis and moderate bilateral neural ?foraminal narrowing at L4-L5 primary due to broad-based disc ?bulging. ?  ?Right paracentral disc herniation at L5-S1 potentially impinging the ?descending right S1 nerve root. ?  ?No acute lumbar spine fracture. ?  ?Nonobstructive 3 mm right renal stone. ?  ?  ?Electronically Signed ?  By: Caprice Renshaw M.D. ?  On: 08/11/2021 12:24  ? ? ? ?Objective:  VS:  HT:    WT:   BMI:     BP:127/89  HR:(!) 120bpm  TEMP: ( )  RESP:  ?Physical Exam ?Vitals and nursing note reviewed.  ?Constitutional:   ?   General: He is not in acute distress. ?   Appearance: Normal appearance. He is not ill-appearing.  ?HENT:  ?   Head: Normocephalic and atraumatic.  ?   Right Ear: External ear normal.  ?   Left Ear: External ear normal.  ?   Nose: No congestion.  ?Eyes:  ?   Extraocular Movements: Extraocular movements intact.  ?Cardiovascular:  ?   Rate and Rhythm: Normal rate.  ?   Pulses: Normal pulses.  ?Pulmonary:  ?   Effort: Pulmonary effort is normal. No respiratory distress.  ?Abdominal:  ?   General: There is no distension.  ?   Palpations: Abdomen is soft.  ?Musculoskeletal:     ?   General: No tenderness or signs of injury.  ?   Cervical back: Neck supple.  ?   Right lower leg: No edema.  ?   Left lower leg: No edema.  ?   Comments: Patient has good distal strength without clonus.  ?Skin: ?   Findings: No erythema or rash.  ?Neurological:  ?   General: No focal deficit present.  ?   Mental Status: He is alert and oriented to person, place, and time.  ?   Sensory:  No sensory deficit.  ?   Motor: No weakness or abnormal muscle tone.  ?   Coordination: Coordination normal.  ?Psychiatric:     ?   Mood and Affect: Mood normal.     ?   Behavior: Behavior normal.  ?  ? ?Imaging: ?No results found. ?

## 2022-01-15 NOTE — Procedures (Signed)
Lumbar Epidural Steroid Injection - Interlaminar Approach with Fluoroscopic Guidance ? ?Patient: Craig Graham      ?Date of Birth: 1991/05/17 ?MRN: [id number] ?PCP: Elwyn Reach, MD      ?Visit Date: 01/05/2022 ?  ?Universal Protocol:    ? ?Consent Given By: the patient ? ?Position: PRONE ? ?Additional Comments: ?Vital signs were monitored before and after the procedure. ?Patient was prepped and draped in the usual sterile fashion. ?The correct patient, procedure, and site was verified. ? ? ?Injection Procedure Details:  ? ?Procedure diagnoses: Lumbar radiculopathy [M54.16]  ? ?Meds Administered:  ?Meds ordered this encounter  ?Medications  ? methylPREDNISolone acetate (DEPO-MEDROL) injection 80 mg  ?  ? ?Laterality: Left ? ?Location/Site:  L5-S1 ? ?Needle: 3.5 in., 20 ga. Tuohy ? ?Needle Placement: Paramedian epidural ? ?Findings:  ? -Comments: Excellent flow of contrast into the epidural space. ? ?Procedure Details: ?Using a paramedian approach from the side mentioned above, the region overlying the inferior lamina was localized under fluoroscopic visualization and the soft tissues overlying this structure were infiltrated with 4 ml. of 1% Lidocaine without Epinephrine. The Tuohy needle was inserted into the epidural space using a paramedian approach.  ? ?The epidural space was localized using loss of resistance along with counter oblique bi-planar fluoroscopic views.  After negative aspirate for air, blood, and CSF, a 2 ml. volume of Isovue-250 was injected into the epidural space and the flow of contrast was observed. Radiographs were obtained for documentation purposes.   ? ?The injectate was administered into the level noted above. ? ? ?Additional Comments:  ?The patient tolerated the procedure well ?Dressing: 2 x 2 sterile gauze and Band-Aid ?  ? ?Post-procedure details: ?Patient was observed during the procedure. ?Post-procedure instructions were reviewed. ? ?Patient left the clinic in stable  condition.  ?

## 2022-02-01 ENCOUNTER — Telehealth: Payer: Self-pay | Admitting: Physical Medicine and Rehabilitation

## 2022-02-01 NOTE — Telephone Encounter (Signed)
Pt called requesting to set an appt for another back injection. Please call pt at [phone]. ?

## 2022-02-05 ENCOUNTER — Telehealth: Payer: Self-pay | Admitting: Physical Medicine and Rehabilitation

## 2022-02-05 NOTE — Telephone Encounter (Signed)
Patient called. Returning a call to [place] 

## 2022-02-08 ENCOUNTER — Other Ambulatory Visit: Payer: Self-pay | Admitting: Physical Medicine and Rehabilitation

## 2022-02-08 ENCOUNTER — Telehealth: Payer: Self-pay | Admitting: Physical Medicine and Rehabilitation

## 2022-02-08 MED ORDER — DIAZEPAM 5 MG PO TABS: ORAL_TABLET | ORAL | 0 refills | Status: AC

## 2022-02-08 NOTE — Telephone Encounter (Signed)
Patient called. Would like to know if something was called in for him to take before coming to see Dr. Alvester Morin today. His call back number is [phone] ?

## 2022-02-08 NOTE — Progress Notes (Signed)
Pre-procedure Valium called in for patient. ?

## 2022-03-08 ENCOUNTER — Encounter: Payer: Self-pay | Admitting: Physical Medicine and Rehabilitation

## 2022-03-08 ENCOUNTER — Ambulatory Visit: Payer: Self-pay

## 2022-03-08 ENCOUNTER — Ambulatory Visit (INDEPENDENT_AMBULATORY_CARE_PROVIDER_SITE_OTHER): Payer: Medicare Other | Admitting: Physical Medicine and Rehabilitation

## 2022-03-08 VITALS — BP 157/99 | HR 123

## 2022-03-08 DIAGNOSIS — M5416 Radiculopathy, lumbar region: Secondary | ICD-10-CM

## 2022-03-08 MED ORDER — METHYLPREDNISOLONE ACETATE 80 MG/ML IJ SUSP
80.0000 mg | Freq: Once | INTRAMUSCULAR | Status: AC
  Administered 2022-03-08: 80 mg

## 2022-03-08 NOTE — Procedures (Signed)
Lumbosacral Transforaminal Epidural Steroid Injection - Sub-Pedicular Approach with Fluoroscopic Guidance  Patient: Craig Graham      Date of Birth: 11-06-1990 MRN: [id number] PCP: Rometta Emery, MD      Visit Date: 03/08/2022   Universal Protocol:    Date/Time: 03/08/2022  Consent Given By: the patient  Position: PRONE  Additional Comments: Vital signs were monitored before and after the procedure. Patient was prepped and draped in the usual sterile fashion. The correct patient, procedure, and site was verified.   Injection Procedure Details:   Procedure diagnoses: Lumbar radiculopathy [M54.16]    Meds Administered:  Meds ordered this encounter  Medications   methylPREDNISolone acetate (DEPO-MEDROL) injection 80 mg    Laterality: Left  Location/Site: L5  Needle:5.0 in., 22 ga.  Short bevel or Quincke spinal needle  Needle Placement: Transforaminal  Findings:    -Comments: Excellent flow of contrast along the nerve, nerve root and into the epidural space.  Procedure Details: After squaring off the end-plates to get a true AP view, the C-arm was positioned so that an oblique view of the foramen as noted above was visualized. The target area is just inferior to the "nose of the scotty dog" or sub pedicular. The soft tissues overlying this structure were infiltrated with 2-3 ml. of 1% Lidocaine without Epinephrine.  The spinal needle was inserted toward the target using a "trajectory" view along the fluoroscope beam.  Under AP and lateral visualization, the needle was advanced so it did not puncture dura and was located close the 6 O'Clock position of the pedical in AP tracterory. Biplanar projections were used to confirm position. Aspiration was confirmed to be negative for CSF and/or blood. A 1-2 ml. volume of Isovue-250 was injected and flow of contrast was noted at each level. Radiographs were obtained for documentation purposes.   After attaining the desired  flow of contrast documented above, a 0.5 to 1.0 ml test dose of 0.25% Marcaine was injected into each respective transforaminal space.  The patient was observed for 90 seconds post injection.  After no sensory deficits were reported, and normal lower extremity motor function was noted,   the above injectate was administered so that equal amounts of the injectate were placed at each foramen (level) into the transforaminal epidural space.   Additional Comments:  The patient tolerated the procedure well Dressing: 2 x 2 sterile gauze and Band-Aid    Post-procedure details: Patient was observed during the procedure. Post-procedure instructions were reviewed.  Patient left the clinic in stable condition.

## 2022-03-08 NOTE — Patient Instructions (Signed)

## 2022-03-08 NOTE — Progress Notes (Signed)
Craig Graham - 31 y.o. male MRN [id number]  Date of birth: [birth year]  Office Visit Note: Visit Date: 03/08/2022 PCP: Rometta Emery, MD Referred by: Rometta Emery, MD  Subjective: Chief Complaint  Patient presents with   Lower Back - Pain   Left Hip - Pain   HPI:  Craig Graham is a 31 y.o. male who comes in today at the request of Dr. Norlene Campbell and Ellin Goodie, FNP for planned Left L5-S1 Lumbar Transforaminal epidural steroid injection with fluoroscopic guidance.  The patient has failed conservative care including home exercise, medications, time and activity modification.  This injection will be diagnostic and hopefully therapeutic.  Please see requesting physician notes for further details and justification.  He continues to have severe pain despite taking 10 mg oxycodone.  Prior lumbar interlaminar epidural did help quite a bit for about 3 weeks.  He has left-sided transitional segment with sacralized transverse process of L5.  Symptoms seem to be almost of an S1 distribution.  Since the diagnostic epidural injection did give him some relief we are going to try a left L5 transforaminal injection today.  He did report last time being scared because his legs began to shake quite a bit after the injection.  I tried to explain to him today that I think some of that is just him tensing up during the injection itself and I think this time with him knowing what we do he should do fine today.  Also he mentioned having a right of the injections.  I did tell him these do show up in MyChart.  He told me did have access to that but had not seen the reports.  I did double check and the last injection report was completed and should be in his MyChart.  This 1 should go there as well.  ROS Otherwise per HPI.  Assessment & Plan: Visit Diagnoses:    ICD-10-CM   1. Lumbar radiculopathy  M54.16 XR C-ARM NO REPORT    Epidural Steroid injection    methylPREDNISolone acetate  (DEPO-MEDROL) injection 80 mg      Plan: No additional findings.   Meds & Orders:  Meds ordered this encounter  Medications   methylPREDNISolone acetate (DEPO-MEDROL) injection 80 mg    Orders Placed This Encounter  Procedures   XR C-ARM NO REPORT   Epidural Steroid injection    Follow-up: Return for visit to requesting provider as needed.   Procedures: No procedures performed  Lumbosacral Transforaminal Epidural Steroid Injection - Sub-Pedicular Approach with Fluoroscopic Guidance  Patient: Craig Graham      Date of Birth: June 04, 1991 MRN: [id number] PCP: Rometta Emery, MD      Visit Date: 03/08/2022   Universal Protocol:    Date/Time: 03/08/2022  Consent Given By: the patient  Position: PRONE  Additional Comments: Vital signs were monitored before and after the procedure. Patient was prepped and draped in the usual sterile fashion. The correct patient, procedure, and site was verified.   Injection Procedure Details:   Procedure diagnoses: Lumbar radiculopathy [M54.16]    Meds Administered:  Meds ordered this encounter  Medications   methylPREDNISolone acetate (DEPO-MEDROL) injection 80 mg    Laterality: Left  Location/Site: L5  Needle:5.0 in., 22 ga.  Short bevel or Quincke spinal needle  Needle Placement: Transforaminal  Findings:    -Comments: Excellent flow of contrast along the nerve, nerve root and into the epidural space.  Procedure Details: After squaring off the end-plates  to get a true AP view, the C-arm was positioned so that an oblique view of the foramen as noted above was visualized. The target area is just inferior to the "nose of the scotty dog" or sub pedicular. The soft tissues overlying this structure were infiltrated with 2-3 ml. of 1% Lidocaine without Epinephrine.  The spinal needle was inserted toward the target using a "trajectory" view along the fluoroscope beam.  Under AP and lateral visualization, the needle was  advanced so it did not puncture dura and was located close the 6 O'Clock position of the pedical in AP tracterory. Biplanar projections were used to confirm position. Aspiration was confirmed to be negative for CSF and/or blood. A 1-2 ml. volume of Isovue-250 was injected and flow of contrast was noted at each level. Radiographs were obtained for documentation purposes.   After attaining the desired flow of contrast documented above, a 0.5 to 1.0 ml test dose of 0.25% Marcaine was injected into each respective transforaminal space.  The patient was observed for 90 seconds post injection.  After no sensory deficits were reported, and normal lower extremity motor function was noted,   the above injectate was administered so that equal amounts of the injectate were placed at each foramen (level) into the transforaminal epidural space.   Additional Comments:  The patient tolerated the procedure well Dressing: 2 x 2 sterile gauze and Band-Aid    Post-procedure details: Patient was observed during the procedure. Post-procedure instructions were reviewed.  Patient left the clinic in stable condition.    Clinical History: CT LUMBAR SPINE WITHOUT CONTRAST   TECHNIQUE: Multidetector CT imaging of the lumbar spine was performed without intravenous contrast administration. Multiplanar CT image reconstructions were also generated.   COMPARISON:  Lumbar spine radiograph 07/20/2021.   FINDINGS: Segmentation: 5 lumbar type vertebrae.   Alignment: Trace retrolisthesis at L5-S1.   Vertebrae: No acute lumbar spine fracture. No suspicious lytic or blastic lesion. Pseudoarticulation of the left L5 transverse process with the sacrum.   Paraspinal and other soft tissues: Nonobstructive 3 mm right renal stone.   Disc levels: There is broad-based disc bulging at L4-L5 and left-sided facet spurring which results in moderate spinal canal stenosis and moderate bilateral neural foraminal narrowing.  At L5-S1, there is a right paracentral disc herniation resulting in lateral recess narrowing and potentially impinging the descending right S1 nerve root. Likely mild right neural foraminal narrowing.   IMPRESSION: Moderate spinal canal stenosis and moderate bilateral neural foraminal narrowing at L4-L5 primary due to broad-based disc bulging.   Right paracentral disc herniation at L5-S1 potentially impinging the descending right S1 nerve root.   No acute lumbar spine fracture.   Nonobstructive 3 mm right renal stone.     Electronically Signed   By: Caprice Renshaw M.D.   On: 08/11/2021 12:24     Objective:  VS:  HT:    WT:   BMI:     BP:(!) 157/99  HR:(!) 123bpm  TEMP: ( )  RESP:  Physical Exam Vitals and nursing note reviewed.  Constitutional:      General: He is not in acute distress.    Appearance: Normal appearance. He is not ill-appearing.  HENT:     Head: Normocephalic and atraumatic.     Right Ear: External ear normal.     Left Ear: External ear normal.     Nose: No congestion.  Eyes:     Extraocular Movements: Extraocular movements intact.  Cardiovascular:     Rate  and Rhythm: Normal rate.     Pulses: Normal pulses.  Pulmonary:     Effort: Pulmonary effort is normal. No respiratory distress.  Abdominal:     General: There is no distension.     Palpations: Abdomen is soft.  Musculoskeletal:        General: No tenderness or signs of injury.     Cervical back: Neck supple.     Right lower leg: No edema.     Left lower leg: No edema.     Comments: Patient has good distal strength without clonus.  Skin:    Findings: No erythema or rash.  Neurological:     General: No focal deficit present.     Mental Status: He is alert and oriented to person, place, and time.     Sensory: No sensory deficit.     Motor: No weakness or abnormal muscle tone.     Coordination: Coordination normal.  Psychiatric:        Mood and Affect: Mood normal.        Behavior:  Behavior normal.     Imaging: XR C-ARM NO REPORT  Result Date: 03/08/2022 Please see Notes tab for imaging impression.

## 2022-03-08 NOTE — Progress Notes (Signed)
Pt state lower back pain that travels to his left buttocks and hip. Pt state sitting and bending makes the pain worse. Pt state he takes pain meds to help ease his pain.  Numeric Pain Rating Scale and Functional Assessment Average Pain 7   In the last MONTH (on 0-10 scale) has pain interfered with the following?  1. General activity like being  able to carry out your everyday physical activities such as walking, climbing stairs, carrying groceries, or moving a chair?  Rating(9)   +Driver, -BT, -Dye Allergies.

## 2022-03-24 DIAGNOSIS — K611 Rectal abscess: Secondary | ICD-10-CM | POA: Diagnosis not present

## 2022-03-24 DIAGNOSIS — K603 Anal fistula: Secondary | ICD-10-CM | POA: Diagnosis not present

## 2022-03-25 DIAGNOSIS — K611 Rectal abscess: Secondary | ICD-10-CM | POA: Diagnosis not present

## 2022-03-25 DIAGNOSIS — F17210 Nicotine dependence, cigarettes, uncomplicated: Secondary | ICD-10-CM | POA: Diagnosis not present

## 2022-06-30 ENCOUNTER — Telehealth: Payer: Self-pay | Admitting: Physical Medicine and Rehabilitation

## 2022-06-30 ENCOUNTER — Other Ambulatory Visit: Payer: Self-pay | Admitting: Physical Medicine and Rehabilitation

## 2022-06-30 DIAGNOSIS — M5416 Radiculopathy, lumbar region: Secondary | ICD-10-CM

## 2022-06-30 NOTE — Telephone Encounter (Signed)
IC advised we would obtain auth and be in touch about scheduling.

## 2022-06-30 NOTE — Telephone Encounter (Signed)
IC s/w patient and stated injection he received in May gave good relief until 3 or 4 days ago. No recent injury. Wanting repeat injection. Ok for this?

## 2022-06-30 NOTE — Telephone Encounter (Signed)
Patient called needing to schedule an appointment with Dr. Alvester Morin for an injection in his back.  The number to contact patient is [phone]
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
## 2022-07-20 ENCOUNTER — Ambulatory Visit: Payer: Medicare Other

## 2022-07-20 ENCOUNTER — Ambulatory Visit (INDEPENDENT_AMBULATORY_CARE_PROVIDER_SITE_OTHER): Payer: Medicare Other | Admitting: Physical Medicine and Rehabilitation

## 2022-07-20 VITALS — BP 131/89 | HR 117

## 2022-07-20 DIAGNOSIS — M5416 Radiculopathy, lumbar region: Secondary | ICD-10-CM

## 2022-07-20 MED ORDER — METHYLPREDNISOLONE ACETATE 80 MG/ML IJ SUSP
40.0000 mg | Freq: Once | INTRAMUSCULAR | Status: AC
  Administered 2022-07-20: 40 mg

## 2022-07-20 NOTE — Progress Notes (Signed)
   Numeric Pain Rating Scale and Functional Assessment Average Pain 4   In the last MONTH (on 0-10 scale) has pain interfered with the following?  1. General activity like being  able to carry out your everyday physical activities such as walking, climbing stairs, carrying groceries, or moving a chair?  Rating(10)   +Driver, -BT, -Dye Allergies.  

## 2022-07-27 NOTE — Procedures (Signed)
Lumbosacral Transforaminal Epidural Steroid Injection - Sub-Pedicular Approach with Fluoroscopic Guidance  Patient: Craig Graham      Date of Birth: 1990/12/23 MRN: [id number] PCP: Elwyn Reach, MD      Visit Date: 07/20/2022   Universal Protocol:    Date/Time: 07/20/2022  Consent Given By: the patient  Position: PRONE  Additional Comments: Vital signs were monitored before and after the procedure. Patient was prepped and draped in the usual sterile fashion. The correct patient, procedure, and site was verified.   Injection Procedure Details:   Procedure diagnoses: Lumbar radiculopathy [M54.16]    Meds Administered:  Meds ordered this encounter  Medications   methylPREDNISolone acetate (DEPO-MEDROL) injection 40 mg    Laterality: Left  Location/Site: L4  Needle:5.0 in., 22 ga.  Short bevel or Quincke spinal needle  Needle Placement: Transforaminal  Findings:    -Comments: Excellent flow of contrast along the nerve, nerve root and into the epidural space.  Procedure Details: After squaring off the end-plates to get a true AP view, the C-arm was positioned so that an oblique view of the foramen as noted above was visualized. The target area is just inferior to the "nose of the scotty dog" or sub pedicular. The soft tissues overlying this structure were infiltrated with 2-3 ml. of 1% Lidocaine without Epinephrine.  The spinal needle was inserted toward the target using a "trajectory" view along the fluoroscope beam.  Under AP and lateral visualization, the needle was advanced so it did not puncture dura and was located close the 6 O'Clock position of the pedical in AP tracterory. Biplanar projections were used to confirm position. Aspiration was confirmed to be negative for CSF and/or blood. A 1-2 ml. volume of Isovue-250 was injected and flow of contrast was noted at each level. Radiographs were obtained for documentation purposes.   After attaining the desired  flow of contrast documented above, a 0.5 to 1.0 ml test dose of 0.25% Marcaine was injected into each respective transforaminal space.  The patient was observed for 90 seconds post injection.  After no sensory deficits were reported, and normal lower extremity motor function was noted,   the above injectate was administered so that equal amounts of the injectate were placed at each foramen (level) into the transforaminal epidural space.   Additional Comments:  The patient tolerated the procedure well Dressing: 2 x 2 sterile gauze and Band-Aid    Post-procedure details: Patient was observed during the procedure. Post-procedure instructions were reviewed.  Patient left the clinic in stable condition.

## 2022-07-27 NOTE — Progress Notes (Signed)
Craig Graham - 31 y.o. male MRN [id number]  Date of birth: 20-Jul-1991  Office Visit Note: Visit Date: 07/20/2022 PCP: Elwyn Reach, MD Referred by: Lorine Bears, NP  Subjective: Chief Complaint  Patient presents with   Lower Back - Pain   HPI:  Craig Graham is a 31 y.o. male who comes in today at the request of Dr. Joni Fears for planned Left L4-5 Lumbar Transforaminal epidural steroid injection with fluoroscopic guidance.  The patient has failed conservative care including home exercise, medications, time and activity modification.  This injection will be diagnostic and hopefully therapeutic.  Please see requesting physician notes for further details and justification. CT scan reviewed.  Patient diagnostically had relief with prior epidural injection on the left L5.  He did not get real sustained good relief however but it was helpful and gave him functional ability after that.  His main issue is disc herniation at L4-5 with canal narrowing because of broad disc herniation.  More right paracentral but pain is on the left.  Seems to be more classic L5 distribution but I think we will try an L4 injection just to see if we get better spread of medication.  He also interestingly has a transitional segment at L5 with the left side being more sacralized and there is a pseudojoint formation on the left at the sacral ALA. depending on relief with epidural injection repeat would look at potential injection of that pseudojoint versus referral to Dr. Laurance Flatten.  Patient can also follow-up with Dr. Durward Fortes.   ROS Otherwise per HPI.  Assessment & Plan: Visit Diagnoses:    ICD-10-CM   1. Lumbar radiculopathy  M54.16 XR C-ARM NO REPORT    Epidural Steroid injection    methylPREDNISolone acetate (DEPO-MEDROL) injection 40 mg      Plan: No additional findings.   Meds & Orders:  Meds ordered this encounter  Medications   methylPREDNISolone acetate (DEPO-MEDROL) injection 40 mg     Orders Placed This Encounter  Procedures   XR C-ARM NO REPORT   Epidural Steroid injection    Follow-up: Return if symptoms worsen or fail to improve.   Procedures: No procedures performed  Lumbosacral Transforaminal Epidural Steroid Injection - Sub-Pedicular Approach with Fluoroscopic Guidance  Patient: Craig Graham      Date of Birth: 09-Nov-1990 MRN: [id number] PCP: Elwyn Reach, MD      Visit Date: 07/20/2022   Universal Protocol:    Date/Time: 07/20/2022  Consent Given By: the patient  Position: PRONE  Additional Comments: Vital signs were monitored before and after the procedure. Patient was prepped and draped in the usual sterile fashion. The correct patient, procedure, and site was verified.   Injection Procedure Details:   Procedure diagnoses: Lumbar radiculopathy [M54.16]    Meds Administered:  Meds ordered this encounter  Medications   methylPREDNISolone acetate (DEPO-MEDROL) injection 40 mg    Laterality: Left  Location/Site: L4  Needle:5.0 in., 22 ga.  Short bevel or Quincke spinal needle  Needle Placement: Transforaminal  Findings:    -Comments: Excellent flow of contrast along the nerve, nerve root and into the epidural space.  Procedure Details: After squaring off the end-plates to get a true AP view, the C-arm was positioned so that an oblique view of the foramen as noted above was visualized. The target area is just inferior to the "nose of the scotty dog" or sub pedicular. The soft tissues overlying this structure were infiltrated with 2-3 ml. of  1% Lidocaine without Epinephrine.  The spinal needle was inserted toward the target using a "trajectory" view along the fluoroscope beam.  Under AP and lateral visualization, the needle was advanced so it did not puncture dura and was located close the 6 O'Clock position of the pedical in AP tracterory. Biplanar projections were used to confirm position. Aspiration was confirmed to be negative  for CSF and/or blood. A 1-2 ml. volume of Isovue-250 was injected and flow of contrast was noted at each level. Radiographs were obtained for documentation purposes.   After attaining the desired flow of contrast documented above, a 0.5 to 1.0 ml test dose of 0.25% Marcaine was injected into each respective transforaminal space.  The patient was observed for 90 seconds post injection.  After no sensory deficits were reported, and normal lower extremity motor function was noted,   the above injectate was administered so that equal amounts of the injectate were placed at each foramen (level) into the transforaminal epidural space.   Additional Comments:  The patient tolerated the procedure well Dressing: 2 x 2 sterile gauze and Band-Aid    Post-procedure details: Patient was observed during the procedure. Post-procedure instructions were reviewed.  Patient left the clinic in stable condition.    Clinical History: CT LUMBAR SPINE WITHOUT CONTRAST   TECHNIQUE: Multidetector CT imaging of the lumbar spine was performed without intravenous contrast administration. Multiplanar CT image reconstructions were also generated.   COMPARISON:  Lumbar spine radiograph 07/20/2021.   FINDINGS: Segmentation: 5 lumbar type vertebrae.   Alignment: Trace retrolisthesis at L5-S1.   Vertebrae: No acute lumbar spine fracture. No suspicious lytic or blastic lesion. Pseudoarticulation of the left L5 transverse process with the sacrum.   Paraspinal and other soft tissues: Nonobstructive 3 mm right renal stone.   Disc levels: There is broad-based disc bulging at L4-L5 and left-sided facet spurring which results in moderate spinal canal stenosis and moderate bilateral neural foraminal narrowing. At L5-S1, there is a right paracentral disc herniation resulting in lateral recess narrowing and potentially impinging the descending right S1 nerve root. Likely mild right neural foraminal narrowing.    IMPRESSION: Moderate spinal canal stenosis and moderate bilateral neural foraminal narrowing at L4-L5 primary due to broad-based disc bulging.   Right paracentral disc herniation at L5-S1 potentially impinging the descending right S1 nerve root.   No acute lumbar spine fracture.   Nonobstructive 3 mm right renal stone.     Electronically Signed   By: Caprice Renshaw M.D.   On: 08/11/2021 12:24     Objective:  VS:  HT:    WT:   BMI:     BP:131/89  HR:(!) 117bpm  TEMP: ( )  RESP:96 % Physical Exam Vitals and nursing note reviewed.  Constitutional:      General: He is not in acute distress.    Appearance: Normal appearance. He is not ill-appearing.  HENT:     Head: Normocephalic and atraumatic.     Right Ear: External ear normal.     Left Ear: External ear normal.     Nose: No congestion.  Eyes:     Extraocular Movements: Extraocular movements intact.  Cardiovascular:     Rate and Rhythm: Normal rate.     Pulses: Normal pulses.  Pulmonary:     Effort: Pulmonary effort is normal. No respiratory distress.  Abdominal:     General: There is no distension.     Palpations: Abdomen is soft.  Musculoskeletal:  General: No tenderness or signs of injury.     Cervical back: Neck supple.     Right lower leg: No edema.     Left lower leg: No edema.     Comments: Patient has good distal strength without clonus.  Skin:    Findings: No erythema or rash.  Neurological:     General: No focal deficit present.     Mental Status: He is alert and oriented to person, place, and time.     Sensory: No sensory deficit.     Motor: No weakness or abnormal muscle tone.     Coordination: Coordination normal.  Psychiatric:        Mood and Affect: Mood normal.        Behavior: Behavior normal.      Imaging: No results found.

## 2022-11-28 ENCOUNTER — Ambulatory Visit (HOSPITAL_COMMUNITY)
Admission: EM | Admit: 2022-11-28 | Discharge: 2022-11-28 | Disposition: A | Discharge status: Home / Self Care | Payer: Medicare Other | Attending: Emergency Medicine | Admitting: Emergency Medicine

## 2022-11-28 ENCOUNTER — Encounter (HOSPITAL_COMMUNITY): Payer: Self-pay

## 2022-11-28 DIAGNOSIS — K5900 Constipation, unspecified: Secondary | ICD-10-CM | POA: Diagnosis not present

## 2022-11-28 NOTE — ED Triage Notes (Signed)
Pt is here for constipation x 1wk

## 2022-11-28 NOTE — Discharge Instructions (Signed)
Drink extra fluids-must take at least 64 ounces of fluid a day.  Increase the MiraLAX to twice a day, soluble/fiber supplement to 3 times a day.  Try Dulcolax.  You can also try a home enema if necessary.. It may take up to 3 days for the miralax to take effect. may also drink prune and apple juice. Return to the ER if you have a fever, if you start having severe abdominal pain, a fever >100.4, or any other concerns.    Go to www.goodrx.com  or www.costplusdrugs.com to look up your medications. This will give you a list of where you can find your prescriptions at the most affordable prices. Or ask the pharmacist what the cash price is, or if they have any other discount programs available to help make your medication more affordable. This can be less expensive than what you would pay with insurance.

## 2022-11-28 NOTE — ED Provider Notes (Signed)
HPI  SUBJECTIVE:  Craig Graham is a 32 y.o. male who presents with constipation for the past 6 days.  Last bowel movement was 6 days ago after taking Ex-Lax.  He states the stool was loose, liquidy, he still felt as if he had stool in his rectum after having the bowel movement.  He reports squeezing anal pain/pressure, and intermittent, minutes long achy, periumbilical pain starting yesterday.  He denies abdominal pain today.  No fevers, nausea, vomiting, abdominal distention, rectal bleeding, anal mass.  He has tried 1 cap of MiraLAX a day, Ex-Lax, milk of magnesia, 2 tablespoons of fiber once a day, abdominal massage, increasing his fluids to 32 ounces a day without improvement in his symptoms.  Abdominal pain is better when he passes gas.  Rectal pain is worse with sitting down.  He takes opiates on an ongoing basis, but denies change in dosing.  He has never had problems with constipation while in the opiates for the past 2 years.  He has a past medical history of perirectal abscess/anal fistula s/p surgery in June 2023 at [facility], chronic back pain, on long-term opiates.  No history of abdominal surgeries.  Patient had a perirectal abscess status post incision and drainage on 03/25/2022.  Was advised to use MiraLAX 1-2 times a day in addition to Metamucil 2 heaping tablespoons 3 times a day.  History reviewed. No pertinent past medical history.  Past Surgical History:  Procedure Laterality Date   FOOT SURGERY Left    left hand surgery      Family History  Problem Relation Age of Onset   COPD Mother    Diabetes Father    Liver cancer Father     Social History   Tobacco Use   Smoking status: Every Day    Packs/day: 1.00    Types: Cigars, Cigarettes   Smokeless tobacco: Never  Substance Use Topics   Alcohol use: No   Drug use: Not Currently    Types: Marijuana    No current facility-administered medications for this encounter.  Current Outpatient Medications:     acetaminophen (TYLENOL) 500 MG tablet, Take 500 mg by mouth every 6 (six) hours as needed., Disp: , Rfl:    cyclobenzaprine (FLEXERIL) 10 MG tablet, Take 10 mg by mouth 3 (three) times daily., Disp: , Rfl:    diazepam (VALIUM) 5 MG tablet, Take 1 by mouth 1 hour  pre-procedure with very light food. May bring 2nd tablet to appointment., Disp: 2 tablet, Rfl: 0   oxyCODONE-acetaminophen (PERCOCET) 10-325 MG tablet, Take 1 tablet by mouth every 6 (six) hours., Disp: , Rfl:    oxyCODONE-acetaminophen (PERCOCET/ROXICET) 5-325 MG tablet, Take 1 tablet by mouth every 6 (six) hours as needed for severe pain., Disp: 10 tablet, Rfl: 0  Allergies  Allergen Reactions   Hydrocodone Hives and Itching     ROS  As noted in HPI.   Physical Exam  BP 128/83 (BP Location: Right Arm)   Pulse (!) 105   Temp 98.9 F (37.2 C) (Oral)   Resp 12   SpO2 96%   Constitutional: Well developed, well nourished, no acute distress Eyes:  EOMI, conjunctiva normal bilaterally HENT: Normocephalic, atraumatic,mucus membranes moist Respiratory: Normal inspiratory effort Cardiovascular: Normal rate GI: Flat, nondistended soft, nontender, active bowel sounds, no rebound, guarding Rectal exam: normal anus.  No perirectal erythema, swelling.  Normal rectal tone.  No external hemorrhoids.  No obvious fissure line.  Minimal drainage.  Minimal stool in vault.  No  gross blood on glove.  Patient declined chaperone skin: No rash, skin intact Musculoskeletal: no deformities Neurologic: Alert & oriented x 3, no focal neuro deficits Psychiatric: Speech and behavior appropriate   ED Course   Medications - No data to display  No orders of the defined types were placed in this encounter.   No results found for this or any previous visit (from the past 24 hour(s)). No results found.  ED Clinical Impression  1. Constipation, unspecified constipation type      ED Assessment/Plan    Outside records reviewed.  As noted  in HPI.    Patient's abdomen is benign.  It is soft, flat, nontender.  He has no abdominal pain at the moment.  There is no evidence of hemorrhoids, perirectal abscess.  No evidence of obstipation.  I did not appreciate any stool in the rectal vault.  Seriously doubt obstruction at this time, as he is still passing gas.  Deferring acute abdominal series today.  Will have patient increase his MiraLAX to twice a day, fiber supplement to 3 times a day, add Dulcolax, and increase fluid intake to 64 ounces of water a day minimum.  May try home enema.  Follow-up with PCP as needed.  ER return precautions given.  DiscussedMDM, treatment plan, and plan for follow-up with patient. Discussed sn/sx that should prompt return to the ED. patient agrees with plan.   No orders of the defined types were placed in this encounter.     *This clinic note was created using Dragon dictation software. Therefore, there may be occasional mistakes despite careful proofreading.  ?    Melynda Ripple, MD 11/28/22 1316

## 2022-11-29 ENCOUNTER — Other Ambulatory Visit: Payer: Self-pay

## 2022-11-29 ENCOUNTER — Emergency Department (HOSPITAL_COMMUNITY)
Admission: EM | Admit: 2022-11-29 | Discharge: 2022-11-29 | Discharge status: Against Medical Advice | Payer: Medicare Other | Attending: Emergency Medicine | Admitting: Emergency Medicine

## 2022-11-29 ENCOUNTER — Emergency Department (HOSPITAL_COMMUNITY): Payer: Medicare Other

## 2022-11-29 ENCOUNTER — Encounter (HOSPITAL_COMMUNITY): Payer: Self-pay

## 2022-11-29 DIAGNOSIS — K5900 Constipation, unspecified: Secondary | ICD-10-CM | POA: Diagnosis not present

## 2022-11-29 DIAGNOSIS — F17290 Nicotine dependence, other tobacco product, uncomplicated: Secondary | ICD-10-CM | POA: Insufficient documentation

## 2022-11-29 DIAGNOSIS — K6289 Other specified diseases of anus and rectum: Secondary | ICD-10-CM

## 2022-11-29 DIAGNOSIS — N200 Calculus of kidney: Secondary | ICD-10-CM | POA: Diagnosis not present

## 2022-11-29 LAB — CBC WITH DIFFERENTIAL/PLATELET
Abs Immature Granulocytes: 0.02 10*3/uL (ref 0.00–0.07)
Basophils Absolute: 0.1 10*3/uL (ref 0.0–0.1)
Basophils Relative: 1 %
Eosinophils Absolute: 0.1 10*3/uL (ref 0.0–0.5)
Eosinophils Relative: 1 %
HCT: 51.6 % (ref 39.0–52.0)
Hemoglobin: 16.1 g/dL (ref 13.0–17.0)
Immature Granulocytes: 0 %
Lymphocytes Relative: 51 %
Lymphs Abs: 3.3 10*3/uL (ref 0.7–4.0)
MCH: 24.4 pg — ABNORMAL LOW (ref 26.0–34.0)
MCHC: 31.2 g/dL (ref 30.0–36.0)
MCV: 78.2 fL — ABNORMAL LOW (ref 80.0–100.0)
Monocytes Absolute: 0.4 10*3/uL (ref 0.1–1.0)
Monocytes Relative: 6 %
Neutro Abs: 2.7 10*3/uL (ref 1.7–7.7)
Neutrophils Relative %: 41 %
Platelets: 248 10*3/uL (ref 150–400)
RBC: 6.6 MIL/uL — ABNORMAL HIGH (ref 4.22–5.81)
RDW: 15.7 % — ABNORMAL HIGH (ref 11.5–15.5)
WBC: 6.5 10*3/uL (ref 4.0–10.5)
nRBC: 0 % (ref 0.0–0.2)

## 2022-11-29 LAB — COMPREHENSIVE METABOLIC PANEL
ALT: 13 U/L (ref 0–44)
AST: 20 U/L (ref 15–41)
Albumin: 4.8 g/dL (ref 3.5–5.0)
Alkaline Phosphatase: 63 U/L (ref 38–126)
Anion gap: 10 (ref 5–15)
BUN: 6 mg/dL (ref 6–20)
CO2: 30 mmol/L (ref 22–32)
Calcium: 10.2 mg/dL (ref 8.9–10.3)
Chloride: 97 mmol/L — ABNORMAL LOW (ref 98–111)
Creatinine, Ser: 1.07 mg/dL (ref 0.61–1.24)
GFR, Estimated: >60 mL/min (ref >60)
Glucose, Bld: 106 mg/dL — ABNORMAL HIGH (ref 70–99)
Potassium: 4.4 mmol/L (ref 3.5–5.1)
Sodium: 137 mmol/L (ref 135–145)
Total Bilirubin: 0.4 mg/dL (ref 0.3–1.2)
Total Protein: 7.9 g/dL (ref 6.5–8.1)

## 2022-11-29 MED ORDER — IOHEXOL 350 MG/ML SOLN
75.0000 mL | Freq: Once | INTRAVENOUS | Status: AC | PRN
  Administered 2022-11-29: 75 mL via INTRAVENOUS

## 2022-11-29 NOTE — ED Provider Triage Note (Signed)
Emergency Medicine Provider Triage Evaluation Note  Craig Graham , a 32 y.o. male  was evaluated in triage.  Pt complains of increasing constipation and abdominal pain over the last week.  Has tried 4 different over-the-counter stool softeners without relief.  Hx of 2 perirectal abscesses last year which required surgery.  States the pain and symptoms feel very similar.  Denies fever, N/V, or blood in the stool.  Review of Systems  Positive:  Negative: See above  Physical Exam  BP 131/87   Pulse (!) 122   Temp 98.5 F (36.9 C)   Resp (!) 22   Ht 5' 10"$  (1.778 m)   Wt 75.3 kg   SpO2 98%   BMI 23.82 kg/m  Gen:   Awake, no distress   Resp:  Normal effort  MSK:   Moves extremities without difficulty  Other:  Abdomen with mild diffuse tenderness.  Does not appear diaphoretic.  Medical Decision Making  Medically screening exam initiated at 6:39 PM.  Appropriate orders placed.  KIREN HAVRON was informed that the remainder of the evaluation will be completed by another provider, this initial triage assessment does not replace that evaluation, and the importance of remaining in the ED until their evaluation is complete.     Prince Rome, PA-C [id number]

## 2022-11-29 NOTE — Discharge Instructions (Signed)
Follow-up with your [facility] surgeon.  Today's workup without any acute findings.

## 2022-11-29 NOTE — ED Triage Notes (Signed)
Pt c/o constipationx1wk. Pt states he is having BM's, but after he uses the bathroom he feels pressure. Pt states he has used miralax, exlax, mag citrate, but it hasn't helped.

## 2022-11-29 NOTE — ED Provider Notes (Addendum)
[facility] Provider Note   CSN: [id number] Arrival date & time: 11/29/22  1753     History  Chief Complaint  Patient presents with   Constipation    Craig Graham is a 32 y.o. male.  Patient with a complaint of constipation for several days.  Patient seen in urgent care yesterday.  Had rectal exam done.  No stool in the vault.  No mention of any concerns for perianal abscess.  Patient's been not taking a lot of laxatives including mag citrate.  Did have a lot of diarrhea early this morning are related with that.  Patient states that he has been having bowel movements.  But he kept feeling discomfort in that area.  Patient status post EUA procedure incision and drainage of right perirectal abscess in June 2023 done at [facility] by Surgeon Quentin Cornwall.  Patient denies any blood in his bowel movements.  No nausea no vomiting.  Past medical history noncontributory.  Smokes cigars every day.       Home Medications Prior to Admission medications   Medication Sig Start Date End Date Taking? Authorizing Provider  acetaminophen (TYLENOL) 500 MG tablet Take 500 mg by mouth every 6 (six) hours as needed.    [provider]  cyclobenzaprine (FLEXERIL) 10 MG tablet Take 10 mg by mouth 3 (three) times daily. 07/24/21   [provider]  diazepam (VALIUM) 5 MG tablet Take 1 by mouth 1 hour  pre-procedure with very light food. May bring 2nd tablet to appointment. 02/08/22   Lorine Bears, NP  oxyCODONE-acetaminophen (PERCOCET) 10-325 MG tablet Take 1 tablet by mouth every 6 (six) hours. 08/06/21   [provider]  oxyCODONE-acetaminophen (PERCOCET/ROXICET) 5-325 MG tablet Take 1 tablet by mouth every 6 (six) hours as needed for severe pain. 04/26/21   Carmin Muskrat, MD      Allergies    Hydrocodone    Review of Systems   Review of Systems  Constitutional:  Negative for chills and fever.  HENT:  Negative for ear pain  and sore throat.   Eyes:  Negative for pain and visual disturbance.  Respiratory:  Negative for cough and shortness of breath.   Cardiovascular:  Negative for chest pain and palpitations.  Gastrointestinal:  Positive for constipation and rectal pain. Negative for abdominal pain, blood in stool and vomiting.  Genitourinary:  Negative for dysuria and hematuria.  Musculoskeletal:  Negative for arthralgias and back pain.  Skin:  Negative for color change and rash.  Neurological:  Negative for seizures and syncope.  All other systems reviewed and are negative.   Physical Exam Updated Vital Signs BP 113/79 (BP Location: Left Arm)   Pulse (!) 122   Temp 97.7 F (36.5 C) (Oral)   Resp 19   Ht 1.778 m (5' 10"$ )   Wt 75.3 kg   SpO2 95%   BMI 23.82 kg/m  Physical Exam Vitals and nursing note reviewed.  Constitutional:      General: He is not in acute distress.    Appearance: Normal appearance. He is well-developed.  HENT:     Head: Normocephalic and atraumatic.  Eyes:     Conjunctiva/sclera: Conjunctivae normal.     Pupils: Pupils are equal, round, and reactive to light.  Cardiovascular:     Rate and Rhythm: Normal rate and regular rhythm.     Heart sounds: No murmur heard. Pulmonary:     Effort: Pulmonary effort is normal. No respiratory distress.  Breath sounds: Normal breath sounds.  Abdominal:     Palpations: Abdomen is soft.     Tenderness: There is no abdominal tenderness.  Genitourinary:    Comments: Perianal area without any tenderness or any redness no fissure.  No external hemorrhoids.  No prolapsed internal hemorrhoids. Musculoskeletal:        General: No swelling.     Cervical back: Normal range of motion and neck supple.  Skin:    General: Skin is warm and dry.     Capillary Refill: Capillary refill takes less than 2 seconds.  Neurological:     General: No focal deficit present.     Mental Status: He is alert and oriented to person, place, and time.   Psychiatric:        Mood and Affect: Mood normal.     ED Results / Procedures / Treatments   Labs (all labs ordered are listed, but only abnormal results are displayed) Labs Reviewed  COMPREHENSIVE METABOLIC PANEL - Abnormal; Notable for the following components:      Result Value   Chloride 97 (*)    Glucose, Bld 106 (*)    All other components within normal limits  CBC WITH DIFFERENTIAL/PLATELET - Abnormal; Notable for the following components:   RBC 6.60 (*)    MCV 78.2 (*)    MCH 24.4 (*)    RDW 15.7 (*)    All other components within normal limits    EKG None  Radiology CT Abdomen Pelvis W Contrast  Result Date: 11/29/2022 CLINICAL DATA:  Bowel obstruction suspected. EXAM: CT ABDOMEN AND PELVIS WITH CONTRAST TECHNIQUE: Multidetector CT imaging of the abdomen and pelvis was performed using the standard protocol following bolus administration of intravenous contrast. RADIATION DOSE REDUCTION: This exam was performed according to the departmental dose-optimization program which includes automated exposure control, adjustment of the mA and/or kV according to patient size and/or use of iterative reconstruction technique. CONTRAST:  36m OMNIPAQUE IOHEXOL 350 MG/ML SOLN COMPARISON:  04/29/2021. FINDINGS: Lower chest: No acute abnormality. Hepatobiliary: No focal liver abnormality is seen. No gallstones, gallbladder wall thickening, or biliary dilatation. Pancreas: Unremarkable. No pancreatic ductal dilatation or surrounding inflammatory changes. Spleen: Normal in size without focal abnormality. Adrenals/Urinary Tract: No adrenal nodule or mass. The kidneys enhance symmetrically. A nonobstructive renal calculus is present on the right. No obstructive uropathy bilaterally. Bladder is unremarkable. Stomach/Bowel: Stomach is within normal limits. Appendix appears normal. No evidence of bowel wall thickening, distention, or inflammatory changes. Fluid attenuation is present in the ascending  and transverse colon, which may be associated with diarrheal illness or colitis. No free air or pneumatosis. Vascular/Lymphatic: No significant vascular findings are present. No enlarged abdominal or pelvic lymph nodes. Reproductive: Prostate is unremarkable. Other: No abdominopelvic ascites. Musculoskeletal: No acute osseous abnormality. IMPRESSION: 1. No evidence of bowel obstruction or free air. 2. Fluid attenuation in the ascending and transverse colon which may be associated with diarrheal illness or colitis. 3. Nonobstructive right renal calculus. Electronically Signed   By: LBrett FairyM.D.   On: 11/29/2022 20:35    Procedures Procedures    Medications Ordered in ED Medications  iohexol (OMNIPAQUE) 350 MG/ML injection 75 mL (75 mLs Intravenous Contrast Given 11/29/22 2016)    ED Course/ Medical Decision Making/ A&P                             Medical Decision Making Amount and/or Complexity of Data Reviewed  Radiology: ordered.  Risk Prescription drug management.   Clinical concern is for recurrent perirectal abscess.  Patient had that I&D by [place] Quentin Cornwall in June 2023.  He is with the [facility] system.  Patient's complete metabolic panel here without significant abnormalities liver function tests are normal.  CBC no leukocytosis hemoglobin good at 16.1.  Platelet count 248.  CT scan abdomen and pelvis is pending to further evaluate possibility of recurrent perirectal abscess.   Patient's complete metabolic panel normal.  CBC white count is 6.5 hemoglobin 610.1.  CT scan abdomen pelvis without any acute findings.  No evidence of bowel obstruction there is a fluid attenuation in the ascending and transverse colon which may be associated with diarrheal illness.  Or colitis.  No constipation.  This is probably all secondary to all the laxatives the patient took and said he was started with some diarrhea earlier this morning.  Perianal exam without any evidence  of perianal abscess or any tenderness or any anal fissure.  Also CT without any evidence of a perirectal abscess.  Patient followed back up with his Psychiatrist.  Patient stable for discharge home  Final Clinical Impression(s) / ED Diagnoses Final diagnoses:  Rectal pain    Rx / DC Orders ED Discharge Orders     None         Fredia Sorrow, MD 11/29/22 2039    Fredia Sorrow, MD 11/29/22 [phone]

## 2023-01-31 ENCOUNTER — Encounter: Payer: Self-pay | Admitting: *Deleted

## 2023-02-07 DIAGNOSIS — R0602 Shortness of breath: Secondary | ICD-10-CM | POA: Diagnosis not present

## 2023-12-26 ENCOUNTER — Ambulatory Visit (HOSPITAL_COMMUNITY)
Admission: EM | Admit: 2023-12-26 | Discharge: 2023-12-26 | Disposition: A | Discharge status: Home / Self Care | Attending: Emergency Medicine | Admitting: Emergency Medicine

## 2023-12-26 ENCOUNTER — Encounter (HOSPITAL_COMMUNITY): Payer: Self-pay

## 2023-12-26 DIAGNOSIS — Z202 Contact with and (suspected) exposure to infections with a predominantly sexual mode of transmission: Secondary | ICD-10-CM | POA: Diagnosis not present

## 2023-12-26 MED ORDER — CEFTRIAXONE SODIUM 1 G IJ SOLR
1.0000 g | Freq: Once | INTRAMUSCULAR | Status: AC
  Administered 2023-12-26: 1 g via INTRAMUSCULAR

## 2023-12-26 MED ORDER — CEFTRIAXONE SODIUM 1 G IJ SOLR
INTRAMUSCULAR | Status: AC
  Filled 2023-12-26: qty 10

## 2023-12-26 MED ORDER — LIDOCAINE HCL (PF) 1 % IJ SOLN
INTRAMUSCULAR | Status: AC
  Filled 2023-12-26: qty 2

## 2023-12-26 NOTE — ED Triage Notes (Signed)
 Pt requesting STD testing. Denies sx's. States positive exposure to gonorrhea or chlamydia. Requesting tx.

## 2023-12-26 NOTE — Discharge Instructions (Addendum)
 discussed with patient that these test results will take 72 hours to return he can follow-up on his MyChart for results If anything else test is positive we will call patient with results and treatment Avoid any unprotected sexual interactions to 7 days

## 2023-12-26 NOTE — ED Provider Notes (Signed)
 [facility]    CSN: [id number] Arrival date & time: 12/26/23  0840      History   Chief Complaint Chief Complaint  Patient presents with   Exposure to STD    HPI Craig Graham is a 33 y.o. male.   Patient presents today for testing and treatment of STD exposure.  Patient reports that his partner tested positive for either chlamydia or gonorrhea on her yearly physical.  Patient states the only symptoms that he notices is a little dysuria.  Patient would like to be treated today.  No treatment prior to arrival.  Denies any abdominal pain no nausea vomiting diarrhea no fever    History reviewed. No pertinent past medical history.  Patient Active Problem List   Diagnosis Date Noted   Anal fistula 06/09/2021   Perirectal abscess 06/02/2021   Left foot pain 06/25/2020   Foreign body (FB) in soft tissue 06/25/2020    Past Surgical History:  Procedure Laterality Date   FOOT SURGERY Left    left hand surgery     rectum surgery         Home Medications    Prior to Admission medications   Medication Sig Start Date End Date Taking? Authorizing Provider  acetaminophen (TYLENOL) 500 MG tablet Take 500 mg by mouth every 6 (six) hours as needed.    [provider]  cyclobenzaprine (FLEXERIL) 10 MG tablet Take 10 mg by mouth 3 (three) times daily. 07/24/21   [provider]  diazepam (VALIUM) 5 MG tablet Take 1 by mouth 1 hour  pre-procedure with very light food. May bring 2nd tablet to appointment. 02/08/22   Juanda Chance, NP  oxyCODONE-acetaminophen (PERCOCET) 10-325 MG tablet Take 1 tablet by mouth every 6 (six) hours. 08/06/21   [provider]  oxyCODONE-acetaminophen (PERCOCET/ROXICET) 5-325 MG tablet Take 1 tablet by mouth every 6 (six) hours as needed for severe pain. 04/26/21   Gerhard Munch, MD    Family History Family History  Problem Relation Age of Onset   COPD Mother    Diabetes Father    Liver cancer Father      Social History Social History   Tobacco Use   Smoking status: Every Day    Types: Cigars   Smokeless tobacco: Never  Substance Use Topics   Alcohol use: No   Drug use: Not Currently    Types: Marijuana     Allergies   Hydrocodone   Review of Systems Review of Systems  Constitutional:  Negative for fever.  Respiratory: Negative.    Cardiovascular: Negative.   Gastrointestinal:  Negative for abdominal pain.  Genitourinary:  Positive for dysuria and penile discharge. Negative for penile pain, penile swelling and scrotal swelling.       Clear intermittent penile discharge     Physical Exam Triage Vital Signs ED Triage Vitals [12/26/23 0903]  Encounter Vitals Group     BP (!) 153/89     Systolic BP Percentile      Diastolic BP Percentile      Pulse Rate 76     Resp 18     Temp 97.9 F (36.6 C)     Temp Source Oral     SpO2 96 %     Weight      Height      Head Circumference      Peak Flow      Pain Score 0     Pain Loc  Pain Education      Exclude from Growth Chart    No data found.  Updated Vital Signs BP (!) 153/89 (BP Location: Right Arm)   Pulse 76   Temp 97.9 F (36.6 C) (Oral)   Resp 18   SpO2 96%   Visual Acuity Right Eye Distance:   Left Eye Distance:   Bilateral Distance:    Right Eye Near:   Left Eye Near:    Bilateral Near:     Physical Exam Constitutional:      Appearance: Normal appearance.  Cardiovascular:     Rate and Rhythm: Normal rate.  Pulmonary:     Effort: Pulmonary effort is normal.  Abdominal:     General: Abdomen is flat. Bowel sounds are normal.     Tenderness: There is no abdominal tenderness. There is no right CVA tenderness or left CVA tenderness.  Neurological:     General: No focal deficit present.     Mental Status: He is alert.      UC Treatments / Results  Labs (all labs ordered are listed, but only abnormal results are displayed) Labs Reviewed  CYTOLOGY, (ORAL, ANAL, URETHRAL) ANCILLARY  ONLY    EKG   Radiology No results found.  Procedures Procedures (including critical care time)  Medications Ordered in UC Medications  cefTRIAXone (ROCEPHIN) injection 1 g (has no administration in time range)    Initial Impression / Assessment and Plan / UC Course  I have reviewed the triage vital signs and the nursing notes.  Pertinent labs & imaging results that were available during my care of the patient were reviewed by me and considered in my medical decision making (see chart for details).     Patient patient is requesting treatment today discussed with patient that these test results will take 72 hours to return he can follow-up on his MyChart for results If anything else test is positive we will call patient with results and treatment Avoid any unprotected sexual interactions to 7 days If symptoms persist patient will need to return Final Clinical Impressions(s) / UC Diagnoses   Final diagnoses:  STD exposure     Discharge Instructions      discussed with patient that these test results will take 72 hours to return he can follow-up on his MyChart for results If anything else test is positive we will call patient with results and treatment Avoid any unprotected sexual interactions to 7 days     ED Prescriptions   None    PDMP not reviewed this encounter.   Coralyn Mark, NP 12/26/23 [phone]

## 2023-12-27 LAB — CYTOLOGY, (ORAL, ANAL, URETHRAL) ANCILLARY ONLY
Chlamydia: NEGATIVE
Comment: NEGATIVE
Comment: NEGATIVE
Comment: NORMAL
Neisseria Gonorrhea: NEGATIVE
Trichomonas: NEGATIVE

## 2024-03-17 ENCOUNTER — Encounter (HOSPITAL_COMMUNITY): Payer: Self-pay | Admitting: *Deleted

## 2024-03-17 ENCOUNTER — Ambulatory Visit (HOSPITAL_COMMUNITY): Admission: EM | Admit: 2024-03-17 | Discharge: 2024-03-17 | Disposition: A | Discharge status: Home / Self Care

## 2024-03-17 DIAGNOSIS — R21 Rash and other nonspecific skin eruption: Secondary | ICD-10-CM | POA: Diagnosis not present

## 2024-03-17 NOTE — ED Triage Notes (Signed)
 Pt states he has a rash/irritation on the left side of his groin. He noticed it last night this morning. He states it doesn't itch or hurt at all just can see it so he wanted it checked.

## 2024-03-17 NOTE — ED Provider Notes (Signed)
 [facility]    CSN: [id number] Arrival date & time: 03/17/24  1501      History   Chief Complaint Chief Complaint  Patient presents with   Rash    HPI Craig Graham is a 33 y.o. male.   Patient presents with irritation to the base of penile shaft that he noticed earlier this morning.  He denies itching, burning, irritation.  He has never experienced similar symptoms.  He does report he recently tried shaving in this area with a new razor.    History reviewed. No pertinent past medical history.  Patient Active Problem List   Diagnosis Date Noted   Anal fistula 06/09/2021   Perirectal abscess 06/02/2021   Left foot pain 06/25/2020   Foreign body (FB) in soft tissue 06/25/2020    Past Surgical History:  Procedure Laterality Date   FOOT SURGERY Left    left hand surgery     rectum surgery         Home Medications    Prior to Admission medications   Medication Sig Start Date End Date Taking? Authorizing Provider  acetaminophen  (TYLENOL ) 500 MG tablet Take 500 mg by mouth every 6 (six) hours as needed.    [provider]  cyclobenzaprine  (FLEXERIL ) 10 MG tablet Take 10 mg by mouth 3 (three) times daily. 07/24/21   [provider]  diazepam  (VALIUM ) 5 MG tablet Take 1 by mouth 1 hour  pre-procedure with very light food. May bring 2nd tablet to appointment. 02/08/22   Williams, Megan E, NP  oxyCODONE -acetaminophen  (PERCOCET) 10-325 MG tablet Take 1 tablet by mouth every 6 (six) hours. 08/06/21   [provider]  oxyCODONE -acetaminophen  (PERCOCET/ROXICET) 5-325 MG tablet Take 1 tablet by mouth every 6 (six) hours as needed for severe pain. 04/26/21   Dorenda Gandy, MD    Family History Family History  Problem Relation Age of Onset   COPD Mother    Diabetes Father    Liver cancer Father     Social History Social History   Tobacco Use   Smoking status: Every Day    Types: Cigars   Smokeless tobacco: Never  Vaping Use    Vaping status: Never Used  Substance Use Topics   Alcohol use: No   Drug use: Not Currently    Types: Marijuana     Allergies   Hydrocodone    Review of Systems Review of Systems  Constitutional:  Negative for chills and fever.  HENT:  Negative for ear pain and sore throat.   Eyes:  Negative for pain and visual disturbance.  Respiratory:  Negative for cough and shortness of breath.   Cardiovascular:  Negative for chest pain and palpitations.  Gastrointestinal:  Negative for abdominal pain and vomiting.  Genitourinary:  Negative for dysuria and hematuria.  Musculoskeletal:  Negative for arthralgias and back pain.  Skin:  Positive for rash. Negative for color change.  Neurological:  Negative for seizures and syncope.  All other systems reviewed and are negative.    Physical Exam Triage Vital Signs ED Triage Vitals  Encounter Vitals Group     BP      Systolic BP Percentile      Diastolic BP Percentile      Pulse      Resp      Temp      Temp src      SpO2      Weight      Height  Head Circumference      Peak Flow      Pain Score      Pain Loc      Pain Education      Exclude from Growth Chart    No data found.  Updated Vital Signs BP 126/82 (BP Location: Right Arm)   Pulse 70   Temp 98.4 F (36.9 C) (Oral)   Resp 18   SpO2 97%   Visual Acuity Right Eye Distance:   Left Eye Distance:   Bilateral Distance:    Right Eye Near:   Left Eye Near:    Bilateral Near:     Physical Exam Vitals and nursing note reviewed.  Constitutional:      General: He is not in acute distress.    Appearance: He is well-developed.  HENT:     Head: Normocephalic and atraumatic.  Eyes:     Conjunctiva/sclera: Conjunctivae normal.  Cardiovascular:     Rate and Rhythm: Normal rate and regular rhythm.     Heart sounds: No murmur heard. Pulmonary:     Effort: Pulmonary effort is normal. No respiratory distress.     Breath sounds: Normal breath sounds.  Abdominal:      Palpations: Abdomen is soft.     Tenderness: There is no abdominal tenderness.  Musculoskeletal:        General: No swelling.     Cervical back: Neck supple.  Skin:    General: Skin is warm and dry.     Capillary Refill: Capillary refill takes less than 2 seconds.     Comments: Small raised skin color bumps at base of penile shaft  Neurological:     Mental Status: He is alert.  Psychiatric:        Mood and Affect: Mood normal.      UC Treatments / Results  Labs (all labs ordered are listed, but only abnormal results are displayed) Labs Reviewed - No data to display  EKG   Radiology No results found.  Procedures Procedures (including critical care time)  Medications Ordered in UC Medications - No data to display  Initial Impression / Assessment and Plan / UC Course  I have reviewed the triage vital signs and the nursing notes.  Pertinent labs & imaging results that were available during my care of the patient were reviewed by me and considered in my medical decision making (see chart for details).     Rash possible folliculitis given shaving in this area.  No ulceration or rash suggestive of herpes.  Patient in monogamous relationship with long-term partner.  Recent STD testing negative.  Supportive care discussed.  Return precautions discussed. Final Clinical Impressions(s) / UC Diagnoses   Final diagnoses:  Rash and nonspecific skin eruption     Discharge Instructions      Recommend hydrocortisone cream twice per day Return if no improvement    ED Prescriptions   None    PDMP not reviewed this encounter.   Ward, Char Common, PA-C 03/17/24 1544

## 2024-03-17 NOTE — Discharge Instructions (Signed)
 Recommend hydrocortisone cream twice per day Return if no improvement

## 2024-10-31 ENCOUNTER — Ambulatory Visit (HOSPITAL_COMMUNITY): Admission: EM | Admit: 2024-10-31 | Discharge: 2024-10-31 | Disposition: A | Discharge status: Home / Self Care

## 2024-10-31 ENCOUNTER — Ambulatory Visit (HOSPITAL_COMMUNITY)

## 2024-10-31 ENCOUNTER — Encounter (HOSPITAL_COMMUNITY): Payer: Self-pay

## 2024-10-31 DIAGNOSIS — M79644 Pain in right finger(s): Secondary | ICD-10-CM | POA: Diagnosis not present

## 2024-10-31 DIAGNOSIS — L03011 Cellulitis of right finger: Secondary | ICD-10-CM

## 2024-10-31 MED ORDER — CEPHALEXIN 500 MG PO CAPS: 500.0000 mg | ORAL_CAPSULE | Freq: Four times a day (QID) | ORAL | 0 refills | Status: AC

## 2024-10-31 NOTE — Discharge Instructions (Addendum)
 You have cellulitis which is a superficial skin infection.  Take Cephalexin  (antibiotic) every 6 hours with a snack/food for the next 5 days. Ibuprofen  and/or Tylenol  as needed for pain. Watch for worsening signs of infection to the lesion such as spreading redness, swelling, pus drainage, pain, fever/chills.  Return if you develop fever, chills, nausea, vomiting, etc as well.

## 2024-10-31 NOTE — ED Provider Notes (Signed)
 " [facility]    CSN: [id number] Arrival date & time: 10/31/24  0800      History   Chief Complaint Chief Complaint  Patient presents with   Swollen Thumb    HPI Craig Graham is a 34 y.o. male.   This 34 year old male is being seen for complaints of right thumb swelling and pain.  He reports approximately 1 week ago he noticed swelling to right.  He denies known injury, trauma.  He reports he does frequently chew his fingernails, however the pain and swelling is in the proximal phalanx.  He reports he has been putting Neosporin on the area for several days.  There is edema, mild erythema, and a hard white area.  There is no drainage or bleeding noted.  He denies numbness, tingling, weakness in extremity.  He denies drainage or bleeding from site.  He denies chest pain, shortness of breath.  He denies fever, chills.     History reviewed. No pertinent past medical history.  Patient Active Problem List   Diagnosis Date Noted   Anal fistula 06/09/2021   Perirectal abscess 06/02/2021   Left foot pain 06/25/2020   Foreign body (FB) in soft tissue 06/25/2020    Past Surgical History:  Procedure Laterality Date   FOOT SURGERY Left    left hand surgery     rectum surgery         Home Medications    Prior to Admission medications  Medication Sig Start Date End Date Taking? Authorizing Provider  cephALEXin  (KEFLEX ) 500 MG capsule Take 1 capsule (500 mg total) by mouth 4 (four) times daily for 5 days. 10/31/24 11/05/24 Yes Kolby Schara C, FNP  acetaminophen  (TYLENOL ) 500 MG tablet Take 500 mg by mouth every 6 (six) hours as needed.    [provider]  cyclobenzaprine  (FLEXERIL ) 10 MG tablet Take 10 mg by mouth 3 (three) times daily. 07/24/21   [provider]  diazepam  (VALIUM ) 5 MG tablet Take 1 by mouth 1 hour  pre-procedure with very light food. May bring 2nd tablet to appointment. 02/08/22   Williams, Megan E, NP  oxyCODONE -acetaminophen   (PERCOCET) 10-325 MG tablet Take 1 tablet by mouth every 6 (six) hours. 08/06/21   [provider]  oxyCODONE -acetaminophen  (PERCOCET/ROXICET) 5-325 MG tablet Take 1 tablet by mouth every 6 (six) hours as needed for severe pain. 04/26/21   Garrick Charleston, MD    Family History Family History  Problem Relation Age of Onset   COPD Mother    Diabetes Father    Liver cancer Father     Social History Social History[1]   Allergies   Hydrocodone    Review of Systems Review of Systems  Constitutional:  Positive for activity change. Negative for chills and fever.  Respiratory:  Negative for shortness of breath.   Cardiovascular:  Negative for chest pain.  Musculoskeletal:  Positive for arthralgias and joint swelling.  Skin:  Positive for color change. Negative for rash.  All other systems reviewed and are negative.    Physical Exam Triage Vital Signs ED Triage Vitals  Encounter Vitals Group     BP 10/31/24 0822 135/74     Girls Systolic BP Percentile --      Girls Diastolic BP Percentile --      Boys Systolic BP Percentile --      Boys Diastolic BP Percentile --      Pulse Rate 10/31/24 0822 84     Resp 10/31/24 0822 17  Temp 10/31/24 0822 99.1 F (37.3 C)     Temp Source 10/31/24 0822 Oral     SpO2 10/31/24 0822 98 %     Weight --      Height --      Head Circumference --      Peak Flow --      Pain Score 10/31/24 0821 7     Pain Loc --      Pain Education --      Exclude from Growth Chart --    No data found.  Updated Vital Signs BP 135/74   Pulse 84   Temp 99.1 F (37.3 C) (Oral)   Resp 17   SpO2 98%   Visual Acuity Right Eye Distance:   Left Eye Distance:   Bilateral Distance:    Right Eye Near:   Left Eye Near:    Bilateral Near:     Physical Exam Vitals and nursing note reviewed.  Constitutional:      General: He is not in acute distress.    Appearance: He is well-developed. He is not ill-appearing or toxic-appearing.      Comments: Pleasant male appearing stated age found sitting in chair in no acute distress.  HENT:     Head: Normocephalic and atraumatic.  Eyes:     Conjunctiva/sclera: Conjunctivae normal.  Cardiovascular:     Rate and Rhythm: Normal rate and regular rhythm.     Heart sounds: Normal heart sounds. No murmur heard. Pulmonary:     Effort: Pulmonary effort is normal. No respiratory distress.     Breath sounds: Normal breath sounds.  Musculoskeletal:     Right hand: Swelling and tenderness present. Decreased range of motion. Normal sensation. Normal capillary refill.     Comments: Right thumb  Skin:    General: Skin is warm and dry.     Capillary Refill: Capillary refill takes less than 2 seconds.     Findings: Wound present.     Comments: Right thumb, no drainage or bleeding observed.  Skin is intact.  Neurological:     Mental Status: He is alert.  Psychiatric:        Mood and Affect: Mood normal.      UC Treatments / Results  Labs (all labs ordered are listed, but only abnormal results are displayed) Labs Reviewed - No data to display  EKG   Radiology DG Finger Thumb Right Result Date: 10/31/2024 CLINICAL DATA:  Right thumb swelling for 1 week without known injury. EXAM: RIGHT THUMB 2+V COMPARISON:  November 01, 2015 FINDINGS: There is no evidence of fracture or dislocation. There is no evidence of arthropathy or other focal bone abnormality. Soft tissues are unremarkable. IMPRESSION: Negative. Electronically Signed   By: Lynwood Landy Raddle M.D.   On: 10/31/2024 09:32    Procedures Procedures (including critical care time)  Medications Ordered in UC Medications - No data to display  Initial Impression / Assessment and Plan / UC Course  I have reviewed the triage vital signs and the nursing notes.  Pertinent labs & imaging results that were available during my care of the patient were reviewed by me and considered in my medical decision making (see chart for details).      Vitals and triage reviewed patient is hemodynamically stable.  Right thumb x-ray obtained to rule out foreign body.  X-ray is negative for acute findings.  Presentation consistent with cellulitis.  He is given prescription for cephalexin .  Advised ibuprofen  and/or Tylenol  as  needed for pain.  Advised to watch for worsening signs of infection such as spreading redness,'s increased swelling, pus drainage, increased or uncontrolled pain, fever, chills.  Plan of care, follow-up care, return precautions given, no questions at this time. Final Clinical Impressions(s) / UC Diagnoses   Final diagnoses:  Thumb pain, right  Cellulitis of finger of right hand     Discharge Instructions      You have cellulitis which is a superficial skin infection.  Take Cephalexin  (antibiotic) every 6 hours with a snack/food for the next 5 days. Ibuprofen  and/or Tylenol  as needed for pain. Watch for worsening signs of infection to the lesion such as spreading redness, swelling, pus drainage, pain, fever/chills.  Return if you develop fever, chills, nausea, vomiting, etc as well.      ED Prescriptions     Medication Sig Dispense Auth. Provider   cephALEXin  (KEFLEX ) 500 MG capsule Take 1 capsule (500 mg total) by mouth 4 (four) times daily for 5 days. 20 capsule Fallyn Munnerlyn C, FNP      PDMP not reviewed this encounter.    [1]  Social History Tobacco Use   Smoking status: Every Day    Types: Cigars   Smokeless tobacco: Never  Vaping Use   Vaping status: Never Used  Substance Use Topics   Alcohol use: No   Drug use: Not Currently    Types: Marijuana     Lennice Jon BROCKS, FNP 10/31/24 [phone]  "

## 2024-10-31 NOTE — ED Triage Notes (Signed)
 Pt has c/o swollen right thumb x 1 week. Pt denies injury to thumb,and denies medication use for pain. Pt has been using neosporin at home.
